# Patient Record
Sex: Female | Born: 1965 | Race: White | Hispanic: No | Marital: Married | Smoking: Current every day smoker
Health system: Southern US, Community
[De-identification: ages and names within clinical notes are randomized; demographics above are authoritative.]

## PROBLEM LIST (undated history)

## (undated) DIAGNOSIS — T4145XA Adverse effect of unspecified anesthetic, initial encounter: Secondary | ICD-10-CM

## (undated) DIAGNOSIS — IMO0002 Reserved for concepts with insufficient information to code with codable children: Secondary | ICD-10-CM

## (undated) DIAGNOSIS — R87619 Unspecified abnormal cytological findings in specimens from cervix uteri: Secondary | ICD-10-CM

## (undated) DIAGNOSIS — R7303 Prediabetes: Secondary | ICD-10-CM

## (undated) DIAGNOSIS — R87629 Unspecified abnormal cytological findings in specimens from vagina: Secondary | ICD-10-CM

## (undated) DIAGNOSIS — L853 Xerosis cutis: Secondary | ICD-10-CM

## (undated) DIAGNOSIS — R140 Abdominal distension (gaseous): Secondary | ICD-10-CM

## (undated) DIAGNOSIS — R1031 Right lower quadrant pain: Principal | ICD-10-CM

## (undated) DIAGNOSIS — R112 Nausea with vomiting, unspecified: Secondary | ICD-10-CM

## (undated) DIAGNOSIS — Z9889 Other specified postprocedural states: Secondary | ICD-10-CM

## (undated) DIAGNOSIS — T8859XA Other complications of anesthesia, initial encounter: Secondary | ICD-10-CM

## (undated) DIAGNOSIS — J302 Other seasonal allergic rhinitis: Secondary | ICD-10-CM

## (undated) DIAGNOSIS — E7800 Pure hypercholesterolemia, unspecified: Secondary | ICD-10-CM

## (undated) DIAGNOSIS — M779 Enthesopathy, unspecified: Secondary | ICD-10-CM

## (undated) HISTORY — DX: Unspecified abnormal cytological findings in specimens from cervix uteri: R87.619

## (undated) HISTORY — DX: Right lower quadrant pain: R10.31

## (undated) HISTORY — PX: TUBAL LIGATION: SHX77

## (undated) HISTORY — DX: Other seasonal allergic rhinitis: J30.2

## (undated) HISTORY — DX: Pure hypercholesterolemia, unspecified: E78.00

## (undated) HISTORY — DX: Unspecified abnormal cytological findings in specimens from vagina: R87.629

## (undated) HISTORY — DX: Xerosis cutis: L85.3

## (undated) HISTORY — DX: Reserved for concepts with insufficient information to code with codable children: IMO0002

## (undated) HISTORY — DX: Enthesopathy, unspecified: M77.9

## (undated) HISTORY — DX: Abdominal distension (gaseous): R14.0

## (undated) HISTORY — PX: ENDOMETRIAL ABLATION: SHX621

## (undated) HISTORY — PX: LEEP: SHX91

---

## 2001-11-04 ENCOUNTER — Encounter: Payer: Self-pay | Admitting: Family Medicine

## 2001-11-04 ENCOUNTER — Ambulatory Visit (HOSPITAL_COMMUNITY): Admission: RE | Admit: 2001-11-04 | Discharge: 2001-11-04 | Payer: Self-pay | Admitting: Family Medicine

## 2003-12-10 ENCOUNTER — Ambulatory Visit (HOSPITAL_COMMUNITY): Admission: RE | Admit: 2003-12-10 | Discharge: 2003-12-10 | Payer: Self-pay | Admitting: Family Medicine

## 2005-01-30 ENCOUNTER — Ambulatory Visit (HOSPITAL_COMMUNITY): Admission: RE | Admit: 2005-01-30 | Discharge: 2005-01-30 | Payer: Self-pay | Admitting: Family Medicine

## 2005-09-11 ENCOUNTER — Ambulatory Visit (HOSPITAL_COMMUNITY): Admission: RE | Admit: 2005-09-11 | Discharge: 2005-09-11 | Payer: Self-pay | Admitting: Family Medicine

## 2006-07-31 ENCOUNTER — Ambulatory Visit (HOSPITAL_COMMUNITY): Admission: RE | Admit: 2006-07-31 | Discharge: 2006-07-31 | Payer: Self-pay | Admitting: Family Medicine

## 2006-08-13 ENCOUNTER — Ambulatory Visit (HOSPITAL_COMMUNITY): Admission: RE | Admit: 2006-08-13 | Discharge: 2006-08-13 | Payer: Self-pay | Admitting: Family Medicine

## 2007-03-20 ENCOUNTER — Encounter: Payer: Self-pay | Admitting: Obstetrics & Gynecology

## 2007-03-20 ENCOUNTER — Ambulatory Visit (HOSPITAL_COMMUNITY): Admission: RE | Admit: 2007-03-20 | Discharge: 2007-03-20 | Payer: Self-pay | Admitting: Obstetrics & Gynecology

## 2008-03-02 ENCOUNTER — Other Ambulatory Visit: Admission: RE | Admit: 2008-03-02 | Discharge: 2008-03-02 | Payer: Self-pay | Admitting: Obstetrics and Gynecology

## 2008-05-20 ENCOUNTER — Ambulatory Visit (HOSPITAL_COMMUNITY): Admission: RE | Admit: 2008-05-20 | Discharge: 2008-05-20 | Payer: Self-pay | Admitting: Orthopaedic Surgery

## 2009-06-30 ENCOUNTER — Ambulatory Visit (HOSPITAL_COMMUNITY): Admission: RE | Admit: 2009-06-30 | Discharge: 2009-06-30 | Payer: Self-pay | Admitting: Obstetrics and Gynecology

## 2009-07-03 HISTORY — PX: OTHER SURGICAL HISTORY: SHX169

## 2009-09-23 ENCOUNTER — Other Ambulatory Visit: Admission: RE | Admit: 2009-09-23 | Discharge: 2009-09-23 | Payer: Self-pay | Admitting: Obstetrics and Gynecology

## 2009-12-13 ENCOUNTER — Ambulatory Visit (HOSPITAL_COMMUNITY): Admission: RE | Admit: 2009-12-13 | Discharge: 2009-12-13 | Payer: Self-pay | Admitting: Family Medicine

## 2010-02-03 ENCOUNTER — Ambulatory Visit (HOSPITAL_COMMUNITY): Admission: RE | Admit: 2010-02-03 | Discharge: 2010-02-03 | Payer: Self-pay | Admitting: Family Medicine

## 2010-07-24 ENCOUNTER — Encounter: Payer: Self-pay | Admitting: Family Medicine

## 2010-07-29 ENCOUNTER — Ambulatory Visit (HOSPITAL_COMMUNITY)
Admission: RE | Admit: 2010-07-29 | Discharge: 2010-07-29 | Payer: Self-pay | Source: Home / Self Care | Attending: Obstetrics & Gynecology | Admitting: Obstetrics & Gynecology

## 2010-11-15 NOTE — H&P (Signed)
NAME:  Danielle Robbins, Danielle Robbins                ACCOUNT NO.:  [id number]   MEDICAL RECORD NO.:  [id number]           PATIENT TYPE:  AMB   LOCATION:  DAY                           FACILITY:  [facility]   PHYSICIAN:  Lazaro Arms, M.D.   DATE OF BIRTH:  Nov 22, 1965   DATE OF ADMISSION:  DATE OF DISCHARGE:  LH                              HISTORY & PHYSICAL   CONTINUATION   As a result of heavy menstrual periods and bad cramping, we did an  ultrasound which revealed a normal uterus, normal endometrial stripe,  not clots, no other abnormalities.  As a result, she also denies any  pain with intercourse or pain during the month otherwise.  As a result,  she is a good candidate for endometrial ablation, and she is admitted  for such.   PAST MEDICAL HISTORY:  Significant for a LEEP procedure.   MEDICATIONS:  Flexeril and Vicodin for chronic back problems.   REVIEW OF SYSTEMS:  As per HPI, otherwise negative.   ALLERGIES:  None.   PHYSICAL EXAMINATION:  VITAL SIGNS:  Weight 144 pounds.  Blood pressure  120/60.  HEENT:  Unremarkable.  NECK:  Thyroid is normal.  LUNGS:  Clear.  HEART: Regular rate and rhythm.  No regurgitation or gallop.  BREASTS:  Without mass, discharge, skin changes.  ABDOMEN:  Benign.  No hepatosplenomegaly or mass.  PELVIC:  Normal external genitalia.  Vagina moist without discharge.  Cervix parous without lesions.  Uterus is normal size, shape, contour.  Ovaries normal, nontender.   IMPRESSION:  1. Menometrorrhagia.  2. Dysmenorrhea.   PLAN:  Patient admitted for hysteroscopy D&C, endometrial ablation. She  understands the risks, benefits, indications, alternatives and will  proceed.      Lazaro Arms, M.D.  Electronically Signed     LHE/MEDQ  D:  03/19/2007  T:  03/19/2007  Job:  [id number]

## 2010-11-15 NOTE — Op Note (Signed)
Danielle Robbins, Danielle Robbins                ACCOUNT NO.:  [id number]   MEDICAL RECORD NO.:  [id number]          PATIENT TYPE:  AMB   LOCATION:  DAY                           FACILITY:  [facility]   PHYSICIAN:  Lazaro Arms, M.D.   DATE OF BIRTH:  Mar 26, 1966   DATE OF PROCEDURE:  03/20/2007  DATE OF DISCHARGE:                               OPERATIVE REPORT   PREOPERATIVE DIAGNOSES:  1. Menometrorrhagia.  2. Dysmenorrhea.   POSTOPERATIVE DIAGNOSIS:  1. Menometrorrhagia.  2. Dysmenorrhea.   OPERATION PERFORMED:  Hysteroscopy dilation and curettage, endometrial  ablation.,   SURGEON:  Lazaro Arms, M.D.   ANESTHESIA:  General endotracheal.   FINDINGS:  The patient had normal endometrial cavity, no abnormalities,  no polyps, no fibroids, no septums.   DESCRIPTION OF PROCEDURE:  The patient was taken to the operating room  and placed in supine position with general endotracheal anesthesia,  placed in dorsal lithotomy position, prepped and draped in the usual  sterile fashion.  Graves speculum was placed.  Cervix was grasped.  Cervix dilated serially to allow passage of the hysteroscope.  Hysteroscopy was performed and found to be normal.  Thermachoice 3  endometrial ablation balloon was then used.  D5W was used 13 mL to  maintain a pressure between 190 and 200 mmHg throughout the procedure.  The total therapy time was 8 minutes and 48 seconds.  Temperature was 88  degrees Celsius.  The patient tolerated the procedure well.  She was  awakened from anesthesia and taken to recovery room in good and stable  condition.  All counts were correct.      Lazaro Arms, M.D.  Electronically Signed     LHE/MEDQ  D:  03/20/2007  T:  03/20/2007  Job:  [id number]

## 2010-11-15 NOTE — H&P (Signed)
NAME:  OSHA, RANE                ACCOUNT NO.:  [id number]   MEDICAL RECORD NO.:  [id number]           PATIENT TYPE:  AMB   LOCATION:  DAY                           FACILITY:  [facility]   PHYSICIAN:  Lazaro Arms, M.D.   DATE OF BIRTH:  10-31-1965   DATE OF ADMISSION:  DATE OF DISCHARGE:  LH                              HISTORY & PHYSICAL   Danielle Robbins is a 45 year old white female, gravida 1, para 1, status post  tubal ligation who has been having increasing difficulty with heavy  menstrual periods.   DICTATION ENDS HERE      Lazaro Arms, M.D.  Electronically Signed     LHE/MEDQ  D:  03/19/2007  T:  03/19/2007  Job:  [id number]

## 2011-04-10 ENCOUNTER — Other Ambulatory Visit (HOSPITAL_COMMUNITY): Payer: Self-pay | Admitting: Family Medicine

## 2011-04-10 ENCOUNTER — Ambulatory Visit (HOSPITAL_COMMUNITY)
Admission: RE | Admit: 2011-04-10 | Discharge: 2011-04-10 | Disposition: A | Discharge status: Home / Self Care | Payer: BC Managed Care – PPO | Source: Ambulatory Visit | Attending: Family Medicine | Admitting: Family Medicine

## 2011-04-10 DIAGNOSIS — R0781 Pleurodynia: Secondary | ICD-10-CM

## 2011-04-10 DIAGNOSIS — F17200 Nicotine dependence, unspecified, uncomplicated: Secondary | ICD-10-CM

## 2011-04-10 DIAGNOSIS — R0789 Other chest pain: Secondary | ICD-10-CM | POA: Insufficient documentation

## 2011-04-13 LAB — COMPREHENSIVE METABOLIC PANEL
BUN: 7
GFR calc Af Amer: >60
Glucose, Bld: 82
Potassium: 3.8
Total Bilirubin: 0.8
Total Protein: 6.9

## 2011-04-13 LAB — CBC
HCT: 41.2
MCHC: 34.8
Platelets: 222
WBC: 7.8

## 2011-04-13 LAB — DIFFERENTIAL
Basophils Relative: 0
Eosinophils Relative: 1
Lymphocytes Relative: 37
Lymphs Abs: 2.9

## 2011-04-13 LAB — URINALYSIS, ROUTINE W REFLEX MICROSCOPIC
Glucose, UA: NEGATIVE
Hgb urine dipstick: NEGATIVE
Nitrite: NEGATIVE
Protein, ur: NEGATIVE
pH: 7

## 2011-07-13 ENCOUNTER — Other Ambulatory Visit: Payer: Self-pay | Admitting: Adult Health

## 2011-07-13 DIAGNOSIS — Z139 Encounter for screening, unspecified: Secondary | ICD-10-CM

## 2011-08-01 ENCOUNTER — Ambulatory Visit (HOSPITAL_COMMUNITY)
Admission: RE | Admit: 2011-08-01 | Discharge: 2011-08-01 | Disposition: A | Discharge status: Home / Self Care | Payer: BC Managed Care – PPO | Source: Ambulatory Visit | Attending: Adult Health | Admitting: Adult Health

## 2011-08-01 DIAGNOSIS — Z139 Encounter for screening, unspecified: Secondary | ICD-10-CM

## 2011-08-01 DIAGNOSIS — Z1231 Encounter for screening mammogram for malignant neoplasm of breast: Secondary | ICD-10-CM | POA: Insufficient documentation

## 2011-09-27 ENCOUNTER — Other Ambulatory Visit: Payer: Self-pay | Admitting: Adult Health

## 2011-09-27 ENCOUNTER — Other Ambulatory Visit (HOSPITAL_COMMUNITY)
Admission: RE | Admit: 2011-09-27 | Discharge: 2011-09-27 | Disposition: A | Discharge status: Home / Self Care | Payer: BC Managed Care – PPO | Source: Ambulatory Visit | Attending: Obstetrics and Gynecology | Admitting: Obstetrics and Gynecology

## 2011-09-27 DIAGNOSIS — Z01419 Encounter for gynecological examination (general) (routine) without abnormal findings: Secondary | ICD-10-CM | POA: Insufficient documentation

## 2011-09-27 DIAGNOSIS — Z1159 Encounter for screening for other viral diseases: Secondary | ICD-10-CM | POA: Insufficient documentation

## 2012-01-28 IMAGING — CR DG RIBS W/ CHEST 3+V*L*
4 series · 4 of 4 positions shown · non-contrast
Comparison: 12/13/2009

CLINICAL DATA: Smoker, left side rib pain for 1 year

LEFT RIBS AND CHEST - 3+ VIEW

[view not recorded (1 of 4)]
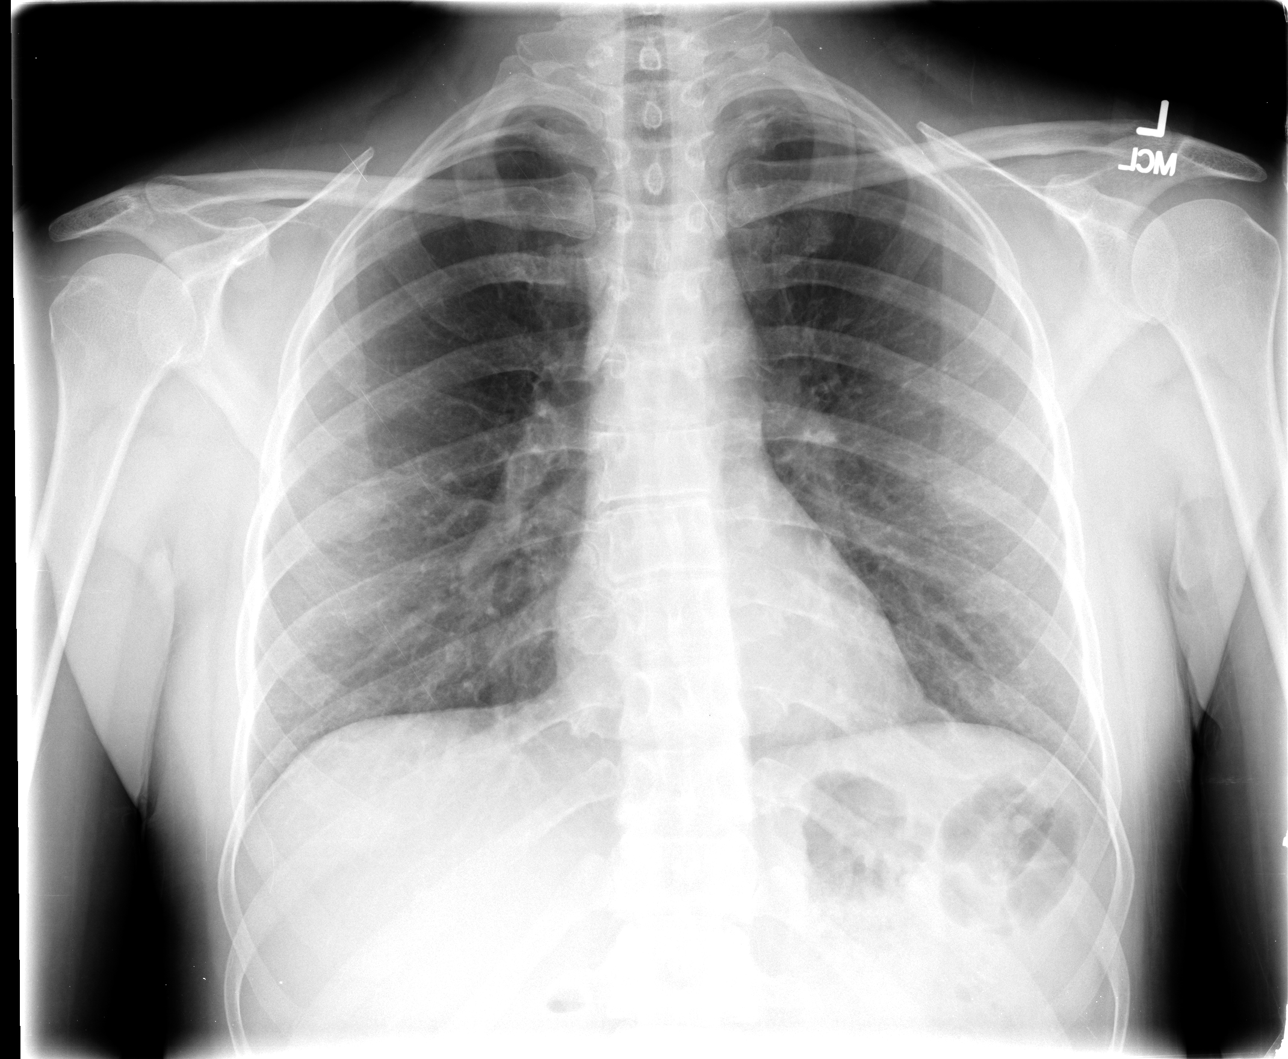

[view not recorded (2 of 4)]
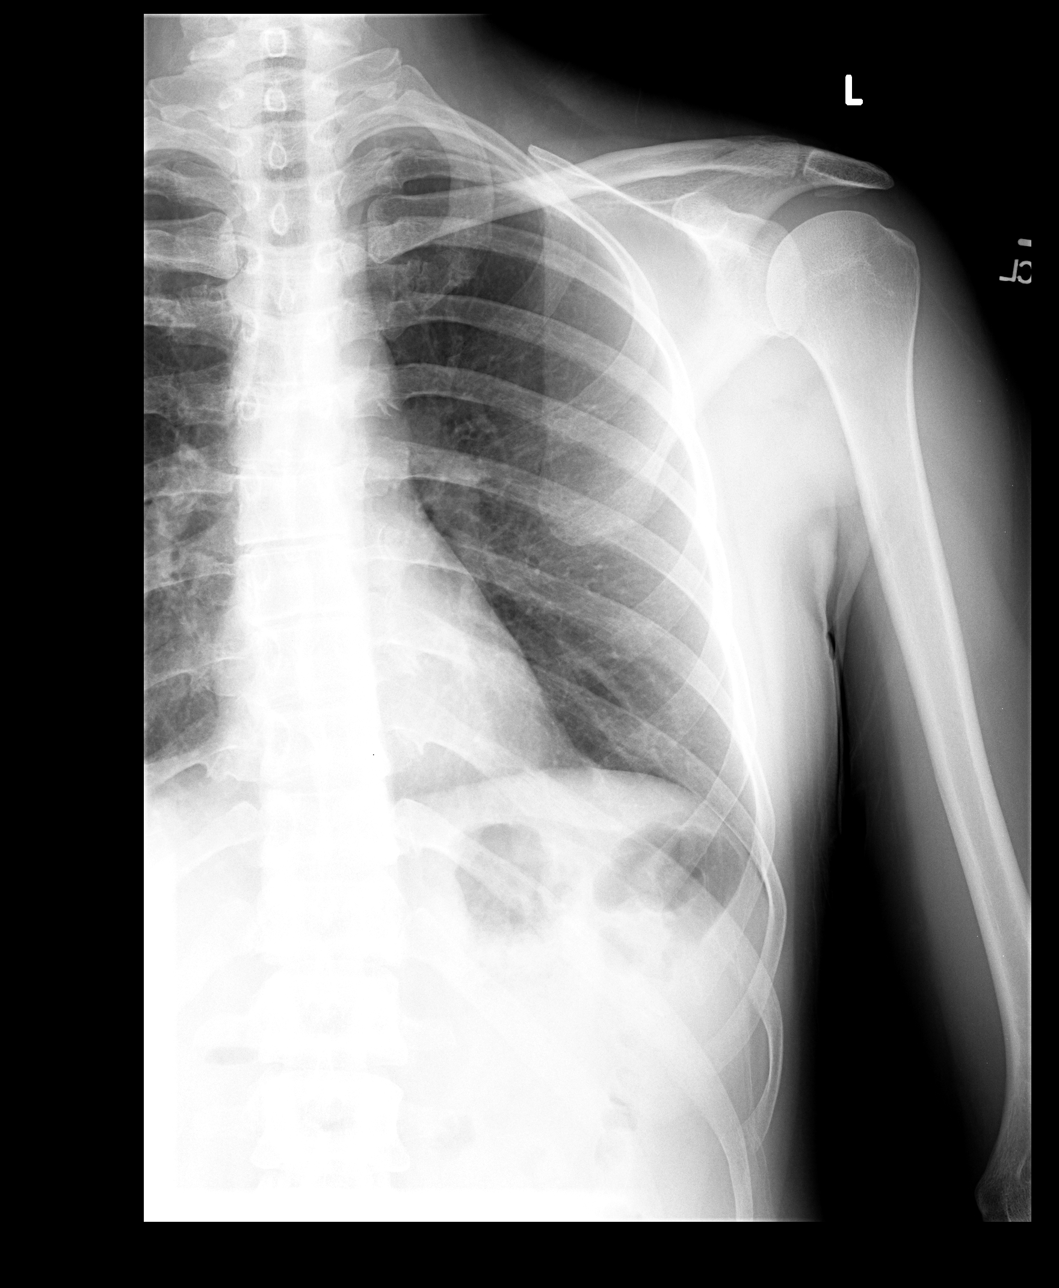

[view not recorded (3 of 4)]
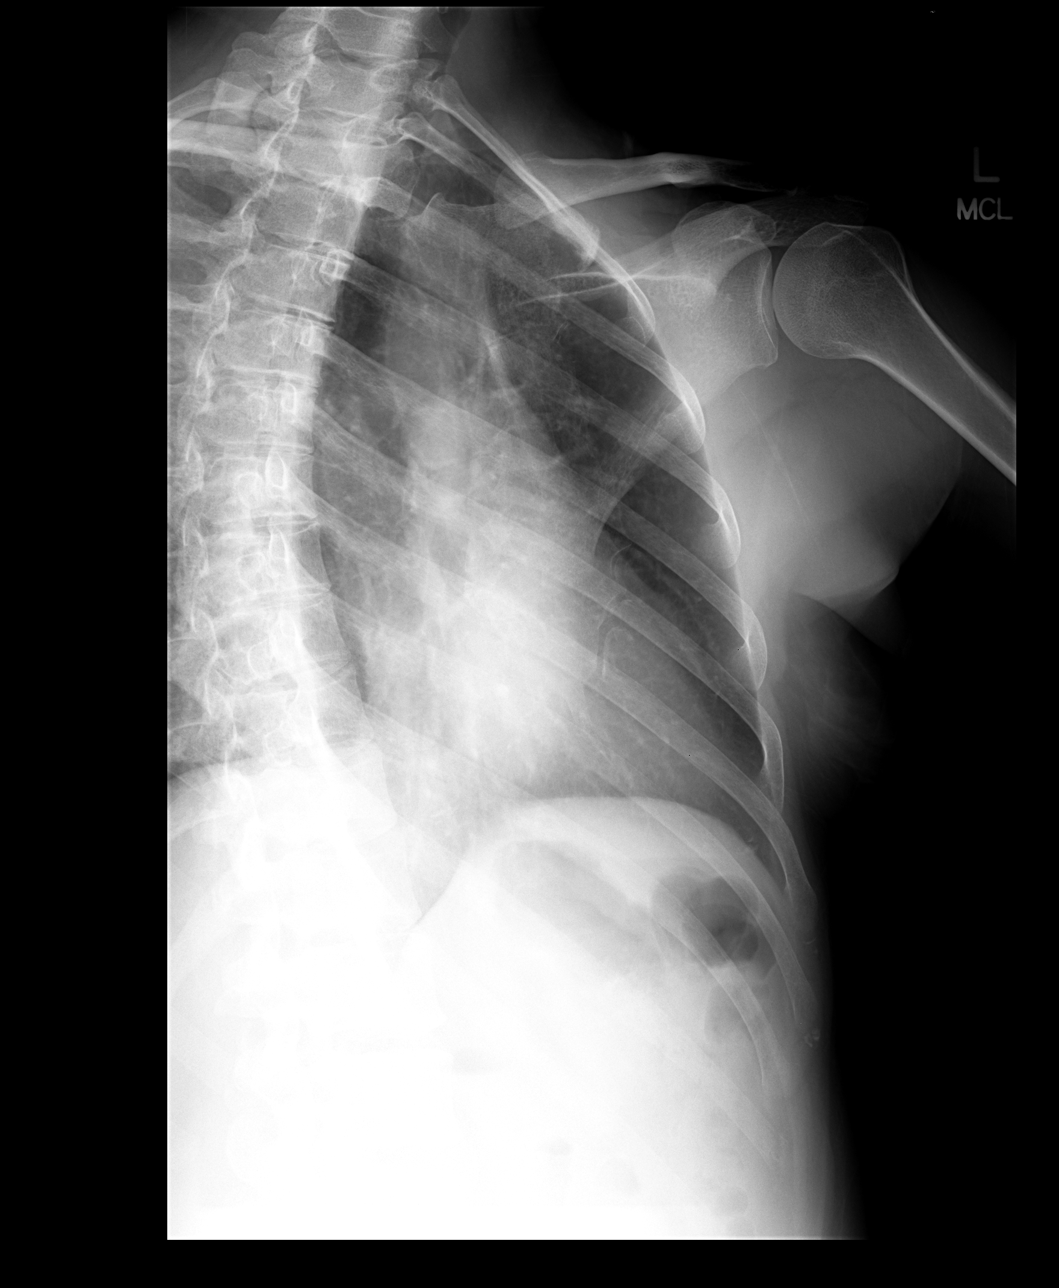

[view not recorded (4 of 4)]
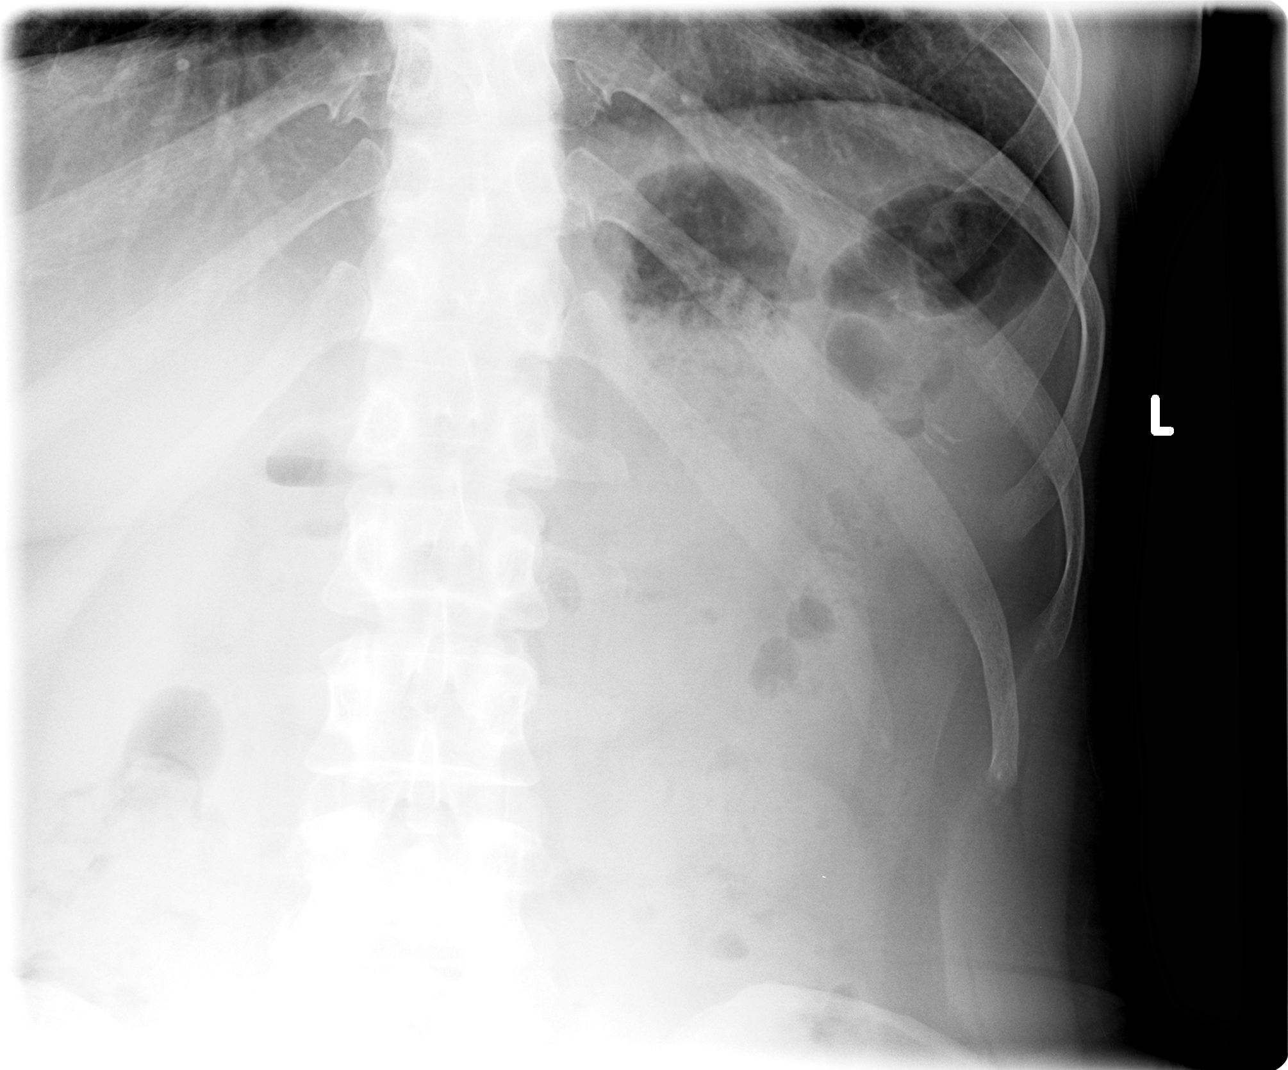

[4 of 4 positions shown; findings below may reference images not displayed]

FINDINGS: Normal heart size, mediastinal contours, and pulmonary vascularity.
Chronic peribronchial thickening.
No pulmonary infiltrate, pleural effusion or pneumothorax.
No acute osseous findings.
IMPRESSION: No acute abnormalities.

## 2012-09-10 ENCOUNTER — Other Ambulatory Visit: Payer: Self-pay | Admitting: Adult Health

## 2012-09-10 DIAGNOSIS — Z139 Encounter for screening, unspecified: Secondary | ICD-10-CM

## 2012-09-12 ENCOUNTER — Ambulatory Visit (HOSPITAL_COMMUNITY)
Admission: RE | Admit: 2012-09-12 | Discharge: 2012-09-12 | Disposition: A | Discharge status: Home / Self Care | Payer: BC Managed Care – PPO | Source: Ambulatory Visit | Attending: Adult Health | Admitting: Adult Health

## 2012-09-12 DIAGNOSIS — Z139 Encounter for screening, unspecified: Secondary | ICD-10-CM

## 2012-09-12 DIAGNOSIS — Z1231 Encounter for screening mammogram for malignant neoplasm of breast: Secondary | ICD-10-CM | POA: Insufficient documentation

## 2012-09-24 ENCOUNTER — Other Ambulatory Visit: Payer: Self-pay | Admitting: Adult Health

## 2012-10-02 ENCOUNTER — Encounter: Payer: Self-pay | Admitting: *Deleted

## 2012-10-02 DIAGNOSIS — E7800 Pure hypercholesterolemia, unspecified: Secondary | ICD-10-CM | POA: Insufficient documentation

## 2012-10-03 ENCOUNTER — Encounter: Payer: Self-pay | Admitting: Adult Health

## 2012-10-03 ENCOUNTER — Ambulatory Visit (INDEPENDENT_AMBULATORY_CARE_PROVIDER_SITE_OTHER): Payer: BC Managed Care – PPO | Admitting: Adult Health

## 2012-10-03 VITALS — BP 114/60 | HR 72 | Ht 64.5 in | Wt 163.0 lb

## 2012-10-03 DIAGNOSIS — E7800 Pure hypercholesterolemia, unspecified: Secondary | ICD-10-CM

## 2012-10-03 DIAGNOSIS — Z0000 Encounter for general adult medical examination without abnormal findings: Secondary | ICD-10-CM

## 2012-10-03 DIAGNOSIS — Z1212 Encounter for screening for malignant neoplasm of rectum: Secondary | ICD-10-CM

## 2012-10-03 DIAGNOSIS — Z01419 Encounter for gynecological examination (general) (routine) without abnormal findings: Secondary | ICD-10-CM

## 2012-10-03 LAB — HEMOCCULT GUIAC POC 1CARD (OFFICE): Fecal Occult Blood, POC: NEGATIVE

## 2012-10-03 NOTE — Patient Instructions (Addendum)
Get fasting  Labs in near future, get mammogram yearly, physical in 1 year, Sign up for my chart, try  Taking 2000iu vitamin D daily.Colonoscopy at 49.

## 2012-10-03 NOTE — Progress Notes (Signed)
Subjective:     Patient ID: Danielle Robbins, female   DOB: [birth year], 47 y.o.   MRN: [id number]  HPIJennie is a  47 year old white female gravida 1 para 1 in today for her physical exam. She is beginning to think she may be going through early menopause, she is having some hot flashes.She does not have periods secondary to endometrial ablation.    Review of Systems Patient denies any headaches, blurred vision, shortness of breath, chest pain, abdominal pain, problems with bowel movements, urination, or intercourse. She denies any musculoskeletal complaints, or current mood changes. She is complaining of some hot flashes and some night sweats, and we did discuss some menopausal symptoms. We also discussed hormone replacement therapy and other options and at this time she can think of about it. I reviewed past medical surgical social and family history in epic. I reviewed allergies and medications.     Objective:   Physical Exam Skin: Warm and dry Neck: Midline trachea. Thyroid normal Lungs: Clear to auscultation bilaterally. Breasts: No dominant palpable mass, retraction, or nipple discharge. Cardiovascular: Regular rate and rhythm. Abdomen: Soft and non-tender, no hepatosplenomegaly. Pelvic: External genitalia is normal and appearance. The vagina is normal in appearance. Cervix is bulbous with negative cervical motion tenderness( no pap this year was normal with Negative HPV last year). Uterus felt to be normal size shape and contour. No adnexal masses or tenderness noted. Rectal: no polyps or hemorrhoids, hemoccult negative. Extremities: no swelling or varicose veins. Psych: no changes.      Assessment:     Yearly exam Hypercholesterolemia   Peri menopausal symptoms Plan:     Take 2000iu per day Mammogram yearly Colonoscopy at 49 Fasting labs in near future   Return to clinic in 1 year physical or prn problems Call if desires any treatment for peri menopausal symptoms

## 2013-03-10 ENCOUNTER — Other Ambulatory Visit: Payer: Self-pay | Admitting: *Deleted

## 2013-03-11 MED ORDER — SIMVASTATIN 20 MG PO TABS: ORAL_TABLET | ORAL | Status: DC

## 2013-03-23 ENCOUNTER — Other Ambulatory Visit: Payer: Self-pay | Admitting: Obstetrics and Gynecology

## 2013-09-24 ENCOUNTER — Other Ambulatory Visit: Payer: Self-pay | Admitting: Adult Health

## 2013-09-24 DIAGNOSIS — Z1231 Encounter for screening mammogram for malignant neoplasm of breast: Secondary | ICD-10-CM

## 2013-09-29 ENCOUNTER — Ambulatory Visit (HOSPITAL_COMMUNITY)
Admission: RE | Admit: 2013-09-29 | Discharge: 2013-09-29 | Disposition: A | Discharge status: Home / Self Care | Payer: BC Managed Care – PPO | Source: Ambulatory Visit | Attending: Adult Health | Admitting: Adult Health

## 2013-09-29 DIAGNOSIS — Z1231 Encounter for screening mammogram for malignant neoplasm of breast: Secondary | ICD-10-CM | POA: Insufficient documentation

## 2013-10-01 ENCOUNTER — Other Ambulatory Visit: Payer: Self-pay | Admitting: Obstetrics & Gynecology

## 2013-10-15 ENCOUNTER — Ambulatory Visit (INDEPENDENT_AMBULATORY_CARE_PROVIDER_SITE_OTHER): Payer: BC Managed Care – PPO | Admitting: Adult Health

## 2013-10-15 ENCOUNTER — Encounter: Payer: Self-pay | Admitting: Adult Health

## 2013-10-15 VITALS — BP 112/64 | HR 74 | Ht 65.0 in | Wt 165.0 lb

## 2013-10-15 DIAGNOSIS — Z1212 Encounter for screening for malignant neoplasm of rectum: Secondary | ICD-10-CM

## 2013-10-15 DIAGNOSIS — E7800 Pure hypercholesterolemia, unspecified: Secondary | ICD-10-CM

## 2013-10-15 DIAGNOSIS — Z01419 Encounter for gynecological examination (general) (routine) without abnormal findings: Secondary | ICD-10-CM

## 2013-10-15 LAB — HEMOCCULT GUIAC POC 1CARD (OFFICE): Fecal Occult Blood, POC: NEGATIVE

## 2013-10-15 NOTE — Progress Notes (Signed)
Patient ID: Danielle Robbins, female   DOB: 1965/09/30, 48 y.o.   MRN: [id number] History of Present Illness:  Danielle Robbins is a 48 year old white female in for a physical, she had a normal pap with negative HPV 09/27/11.  Current Medications, Allergies, Past Medical History, Past Surgical History, Family History and Social History were reviewed in [employer] record.     Review of Systems:  Patient denies any headaches,but has sinus pressure,no blurred vision, shortness of breath, chest pain, abdominal pain, problems with bowel movements, urination, or intercourse. No joint pain or mood swings.She is seeing ENT, Dr Redmond Pulling in [place].   Physical Exam:BP 112/64  Pulse 74  Ht 5\' 5"  (1.651 m)  Wt 165 lb (74.844 kg)  BMI 27.46 kg/m2 General:  Well developed, well nourished, no acute distress Skin:  Warm and dry Neck:  Midline trachea, normal thyroid Lungs; Clear to auscultation bilaterally Breast:  No dominant palpable mass, retraction, or nipple discharge Cardiovascular: Regular rate and rhythm Abdomen:  Soft, non tender, no hepatosplenomegaly Pelvic:  External genitalia is normal in appearance.  The vagina is normal in appearance. The cervix is bulbous.  Uterus is felt to be normal size, shape, and contour.  No adnexal masses or tenderness noted. Rectal: Good sphincter tone, no polyps, or hemorrhoids felt.  Hemoccult negative. Extremities:  No swelling or varicosities noted Psych:  No mood changes, alert and cooperative, seems happy   Impression: Yearly gyn exam, no pap Elevated cholesterol    Plan: Physical in 1 year Mammogram yearly Colonoscopy at 88  Labs in near future fasting, CBC,CMP,TSH and lipids

## 2013-10-15 NOTE — Patient Instructions (Signed)
Physical in 1 year Mammogram yearly Colonoscopy at 37 Labs in near future

## 2013-10-16 ENCOUNTER — Other Ambulatory Visit: Payer: BC Managed Care – PPO

## 2013-10-16 DIAGNOSIS — Z13220 Encounter for screening for lipoid disorders: Secondary | ICD-10-CM

## 2013-10-16 DIAGNOSIS — Z1329 Encounter for screening for other suspected endocrine disorder: Secondary | ICD-10-CM

## 2013-10-16 DIAGNOSIS — Z0000 Encounter for general adult medical examination without abnormal findings: Secondary | ICD-10-CM

## 2013-10-16 LAB — COMPREHENSIVE METABOLIC PANEL
ALT: 16 U/L (ref 0–35)
AST: 19 U/L (ref 0–37)
Albumin: 4.2 g/dL (ref 3.5–5.2)
Alkaline Phosphatase: 86 U/L (ref 39–117)
BUN: 9 mg/dL (ref 6–23)
CALCIUM: 9.3 mg/dL (ref 8.4–10.5)
CO2: 27 mEq/L (ref 19–32)
CREATININE: 0.78 mg/dL (ref 0.50–1.10)
Chloride: 105 mEq/L (ref 96–112)
Glucose, Bld: 89 mg/dL (ref 70–99)
POTASSIUM: 4 meq/L (ref 3.5–5.3)
Sodium: 140 mEq/L (ref 135–145)
Total Bilirubin: 0.5 mg/dL (ref 0.2–1.2)
Total Protein: 7 g/dL (ref 6.0–8.3)

## 2013-10-16 LAB — CBC
HEMATOCRIT: 43.8 % (ref 36.0–46.0)
HEMOGLOBIN: 15.3 g/dL — AB (ref 12.0–15.0)
MCH: 30.4 pg (ref 26.0–34.0)
MCHC: 34.9 g/dL (ref 30.0–36.0)
MCV: 86.9 fL (ref 78.0–100.0)
Platelets: 214 10*3/uL (ref 150–400)
RBC: 5.04 MIL/uL (ref 3.87–5.11)
RDW: 13.5 % (ref 11.5–15.5)
WBC: 8.2 10*3/uL (ref 4.0–10.5)

## 2013-10-16 LAB — LIPID PANEL
CHOLESTEROL: 189 mg/dL (ref 0–200)
HDL: 36 mg/dL — ABNORMAL LOW (ref >39)
LDL Cholesterol: 129 mg/dL — ABNORMAL HIGH (ref 0–99)
TRIGLYCERIDES: 121 mg/dL (ref <150)
Total CHOL/HDL Ratio: 5.3 Ratio
VLDL: 24 mg/dL (ref 0–40)

## 2013-10-16 LAB — TSH: TSH: 1.187 u[IU]/mL (ref 0.350–4.500)

## 2013-10-21 ENCOUNTER — Telehealth: Payer: Self-pay | Admitting: Adult Health

## 2013-10-21 NOTE — Telephone Encounter (Signed)
Pt aware of labs and need to decrease carbs,increase activity and take 3 gm krill oil in divided doses with a meal

## 2014-01-29 ENCOUNTER — Ambulatory Visit (HOSPITAL_COMMUNITY)
Admission: RE | Admit: 2014-01-29 | Discharge: 2014-01-29 | Disposition: A | Discharge status: Home / Self Care | Payer: BC Managed Care – PPO | Source: Ambulatory Visit | Attending: Family Medicine | Admitting: Family Medicine

## 2014-01-29 ENCOUNTER — Other Ambulatory Visit (HOSPITAL_COMMUNITY): Payer: Self-pay | Admitting: Family Medicine

## 2014-01-29 DIAGNOSIS — F17210 Nicotine dependence, cigarettes, uncomplicated: Secondary | ICD-10-CM

## 2014-01-29 DIAGNOSIS — F17200 Nicotine dependence, unspecified, uncomplicated: Secondary | ICD-10-CM | POA: Insufficient documentation

## 2014-01-29 DIAGNOSIS — R079 Chest pain, unspecified: Secondary | ICD-10-CM

## 2014-03-10 ENCOUNTER — Other Ambulatory Visit: Payer: Self-pay | Admitting: Obstetrics & Gynecology

## 2014-05-04 ENCOUNTER — Encounter: Payer: Self-pay | Admitting: Adult Health

## 2014-08-24 ENCOUNTER — Other Ambulatory Visit: Payer: Self-pay | Admitting: Obstetrics & Gynecology

## 2014-09-08 ENCOUNTER — Other Ambulatory Visit: Payer: Self-pay | Admitting: Adult Health

## 2014-09-08 DIAGNOSIS — Z1231 Encounter for screening mammogram for malignant neoplasm of breast: Secondary | ICD-10-CM

## 2014-09-29 ENCOUNTER — Telehealth: Payer: Self-pay | Admitting: *Deleted

## 2014-09-29 ENCOUNTER — Telehealth: Payer: Self-pay | Admitting: Obstetrics & Gynecology

## 2014-09-29 MED ORDER — SIMVASTATIN 20 MG PO TABS: 20.0000 mg | ORAL_TABLET | Freq: Every evening | ORAL | Status: DC

## 2014-09-29 NOTE — Telephone Encounter (Signed)
Received a [facility] from [facility] for refill request for Simvastatin 20 mg. Rx for Simvastatin 20 mg q evening in Epic from 08/24/2014 with 4 refills, however pt needed a 90 day supply due to insurance. Simvastatin 20 mg tab po daily, #90, no refills [facility] [place]. Pt has annual scheduled in April.

## 2014-10-02 ENCOUNTER — Ambulatory Visit (HOSPITAL_COMMUNITY): Payer: Self-pay

## 2014-10-05 ENCOUNTER — Ambulatory Visit (HOSPITAL_COMMUNITY): Payer: Self-pay

## 2014-10-20 ENCOUNTER — Encounter: Payer: Self-pay | Admitting: Adult Health

## 2014-10-20 ENCOUNTER — Other Ambulatory Visit (HOSPITAL_COMMUNITY)
Admission: RE | Admit: 2014-10-20 | Discharge: 2014-10-20 | Disposition: A | Discharge status: Home / Self Care | Payer: BLUE CROSS/BLUE SHIELD | Source: Ambulatory Visit | Attending: Adult Health | Admitting: Adult Health

## 2014-10-20 ENCOUNTER — Ambulatory Visit (INDEPENDENT_AMBULATORY_CARE_PROVIDER_SITE_OTHER): Payer: BLUE CROSS/BLUE SHIELD | Admitting: Adult Health

## 2014-10-20 VITALS — BP 118/64 | HR 84 | Ht 65.0 in | Wt 172.0 lb

## 2014-10-20 DIAGNOSIS — Z1212 Encounter for screening for malignant neoplasm of rectum: Secondary | ICD-10-CM

## 2014-10-20 DIAGNOSIS — Z1151 Encounter for screening for human papillomavirus (HPV): Secondary | ICD-10-CM | POA: Diagnosis present

## 2014-10-20 DIAGNOSIS — L853 Xerosis cutis: Secondary | ICD-10-CM

## 2014-10-20 DIAGNOSIS — Z01419 Encounter for gynecological examination (general) (routine) without abnormal findings: Secondary | ICD-10-CM

## 2014-10-20 DIAGNOSIS — E7800 Pure hypercholesterolemia, unspecified: Secondary | ICD-10-CM

## 2014-10-20 HISTORY — DX: Xerosis cutis: L85.3

## 2014-10-20 LAB — HEMOCCULT GUIAC POC 1CARD (OFFICE): Fecal Occult Blood, POC: NEGATIVE

## 2014-10-20 NOTE — Patient Instructions (Signed)
Mammogram yearly Colonoscopy at 49  Physical in 1 year

## 2014-10-20 NOTE — Progress Notes (Signed)
Patient ID: Danielle Robbins, female   DOB: 1965/11/10, 49 y.o.   MRN: [id number] History of Present Illness: Danielle Robbins is a 49 year old white female in for well woman gyn exam and pap.She complains of dry skin.   Current Medications, Allergies, Past Medical History, Past Surgical History, Family History and Social History were reviewed in [employer] record.     Review of Systems: Patient denies any headaches, hearing loss, fatigue, blurred vision, shortness of breath, chest pain, abdominal pain, problems with bowel movements, urination, or intercourse. No joint pain or mood swings.Has dry skin.    Physical Exam:BP 118/64 mmHg  Pulse 84  Ht 5\' 5"  (1.651 m)  Wt 172 lb (78.019 kg)  BMI 28.62 kg/m2 General:  Well developed, well nourished, no acute distress Skin:  Warm and dry,tan, she has tanning bed, Neck:  Midline trachea, normal thyroid, good ROM, no lymphadenopathy Lungs; Clear to auscultation bilaterally Breast:  No dominant palpable mass, retraction, or nipple discharge Cardiovascular: Regular rate and rhythm Abdomen:  Soft, non tender, no hepatosplenomegaly Pelvic:  External genitalia is normal in appearance, no lesions.  The vagina is normal in appearance. Urethra has no lesions or masses. The cervix is bulbous.  Uterus is felt to be normal size, shape, and contour.  No adnexal masses or tenderness noted.Bladder is non tender, no masses felt. Rectal: Good sphincter tone, no polyps, internal and external hemorrhoids felt.  Hemoccult negative. Extremities/musculoskeletal:  No swelling or varicosities noted, no clubbing or cyanosis Psych:  No mood changes, alert and cooperative,seems happy Do not tan daily and use creams not lotions  Impression: Well woman gyn exam with pap Dry skin Elevated cholesterol     Plan: Check CMP and lipids in am Physical in 1 year Mammogram yearly Colonoscopy at 49 Try cetaphil or aqua phor Continue meds, has refills

## 2014-10-22 LAB — CYTOLOGY - PAP

## 2014-10-22 LAB — COMPREHENSIVE METABOLIC PANEL
ALT: 21 IU/L (ref 0–32)
AST: 22 IU/L (ref 0–40)
Albumin/Globulin Ratio: 1.5 (ref 1.1–2.5)
Albumin: 4.1 g/dL (ref 3.5–5.5)
Alkaline Phosphatase: 99 IU/L (ref 39–117)
BUN/Creatinine Ratio: 14 (ref 9–23)
BUN: 11 mg/dL (ref 6–24)
Bilirubin Total: 0.4 mg/dL (ref 0.0–1.2)
CALCIUM: 9.4 mg/dL (ref 8.7–10.2)
CO2: 22 mmol/L (ref 18–29)
CREATININE: 0.8 mg/dL (ref 0.57–1.00)
Chloride: 102 mmol/L (ref 97–108)
GFR calc Af Amer: 100 mL/min/{1.73_m2} (ref >59)
GFR calc non Af Amer: 87 mL/min/{1.73_m2} (ref >59)
Globulin, Total: 2.7 g/dL (ref 1.5–4.5)
Glucose: 91 mg/dL (ref 65–99)
POTASSIUM: 4 mmol/L (ref 3.5–5.2)
SODIUM: 141 mmol/L (ref 134–144)
Total Protein: 6.8 g/dL (ref 6.0–8.5)

## 2014-10-22 LAB — LIPID PANEL
CHOL/HDL RATIO: 6 ratio — AB (ref 0.0–4.4)
Cholesterol, Total: 211 mg/dL — ABNORMAL HIGH (ref 100–199)
HDL: 35 mg/dL — ABNORMAL LOW (ref >39)
LDL Calculated: 140 mg/dL — ABNORMAL HIGH (ref 0–99)
Triglycerides: 180 mg/dL — ABNORMAL HIGH (ref 0–149)
VLDL Cholesterol Cal: 36 mg/dL (ref 5–40)

## 2014-10-28 ENCOUNTER — Telehealth: Payer: Self-pay | Admitting: Adult Health

## 2014-10-28 MED ORDER — ATORVASTATIN CALCIUM 20 MG PO TABS: ORAL_TABLET | ORAL | Status: DC

## 2014-10-28 NOTE — Telephone Encounter (Signed)
Left message to call me about labs  

## 2014-10-28 NOTE — Telephone Encounter (Signed)
Pt aware of labs, will stop Zocor and change to Lipitor 20 mg and recheck labs in 12 weeks

## 2014-12-14 ENCOUNTER — Ambulatory Visit (HOSPITAL_COMMUNITY)
Admission: RE | Admit: 2014-12-14 | Discharge: 2014-12-14 | Disposition: A | Discharge status: Home / Self Care | Payer: BLUE CROSS/BLUE SHIELD | Source: Ambulatory Visit | Attending: Adult Health | Admitting: Adult Health

## 2014-12-14 DIAGNOSIS — Z1231 Encounter for screening mammogram for malignant neoplasm of breast: Secondary | ICD-10-CM | POA: Insufficient documentation

## 2015-02-26 ENCOUNTER — Other Ambulatory Visit: Payer: Self-pay | Admitting: Adult Health

## 2015-03-01 ENCOUNTER — Other Ambulatory Visit: Payer: Self-pay | Admitting: *Deleted

## 2015-03-01 MED ORDER — ATORVASTATIN CALCIUM 20 MG PO TABS: 20.0000 mg | ORAL_TABLET | Freq: Every day | ORAL | Status: DC

## 2015-03-12 ENCOUNTER — Ambulatory Visit (HOSPITAL_COMMUNITY)
Admission: RE | Admit: 2015-03-12 | Discharge: 2015-03-12 | Disposition: A | Discharge status: Home / Self Care | Payer: BLUE CROSS/BLUE SHIELD | Source: Ambulatory Visit | Attending: Adult Health | Admitting: Adult Health

## 2015-03-12 ENCOUNTER — Encounter: Payer: Self-pay | Admitting: Adult Health

## 2015-03-12 ENCOUNTER — Ambulatory Visit (INDEPENDENT_AMBULATORY_CARE_PROVIDER_SITE_OTHER): Payer: BLUE CROSS/BLUE SHIELD | Admitting: Adult Health

## 2015-03-12 ENCOUNTER — Ambulatory Visit (HOSPITAL_COMMUNITY): Payer: BLUE CROSS/BLUE SHIELD

## 2015-03-12 VITALS — BP 118/72 | HR 84 | Temp 98.6°F | Ht 65.0 in | Wt 176.0 lb

## 2015-03-12 DIAGNOSIS — R1031 Right lower quadrant pain: Secondary | ICD-10-CM

## 2015-03-12 DIAGNOSIS — R140 Abdominal distension (gaseous): Secondary | ICD-10-CM | POA: Diagnosis not present

## 2015-03-12 HISTORY — DX: Abdominal distension (gaseous): R14.0

## 2015-03-12 HISTORY — DX: Right lower quadrant pain: R10.31

## 2015-03-12 LAB — POCT URINALYSIS DIPSTICK
GLUCOSE UA: NEGATIVE
LEUKOCYTES UA: NEGATIVE
NITRITE UA: NEGATIVE
Protein, UA: NEGATIVE
RBC UA: NEGATIVE

## 2015-03-12 NOTE — Progress Notes (Signed)
Subjective:     Patient ID: Danielle Robbins, female   DOB: Sep 19, 1965, 49 y.o.   MRN: [id number]  HPI Danielle Robbins is a 49 year old white female in complaining of pain in right side for 1 week and has bloating that is worse in last day or so, no problems voiding or with BMs had 1 today.Felt bad yesterday and left work and went home to sleep.She is sp ablation so has no period.  Review of Systems Patient denies any headaches, hearing loss, fatigue, blurred vision, shortness of breath, chest pain, problems with bowel movements, urination, or intercourse. No joint pain or mood swings.See HPI for positives.    Reviewed past medical,surgical, social and family history. Reviewed medications and allergies.  Objective:   Physical Exam BP 118/72 mmHg  Pulse 84  Temp(Src) 98.6 F (37 C)  Ht '5\' 5"'  (1.651 m)  Wt 176 lb (79.833 kg)  BMI 29.29 kg/m2  Urine negative, Skin warm and dry.Pelvic: external genitalia is normal in appearance no lesions, vagina: scant discharge without odor,urethra has no lesions or masses noted, cervix:smooth,negative CMT uterus: normal size, shape and contour, mildly tender, no masses felt, adnexa: no masses, RLQ tenderness noted, no rebound. Bladder is non tender and no masses felt.Adomen has good bowel sounds in all 4 quadrants, soft, is tender RLQ, looks distended, no waves, like with fluid.Will get Korea now and labs and F/U in 3 days.She declines pain meds,talked could be ovarian cyst.Will get Xray to call me if abnormal Korea.    Assessment:     RLQ pain Bloating     Plan:     Get gyn [place] now at [facility] CBC,CMP and ESR Follow up in 3 days Review handout on pelvic pain If has fever,increased pain or vomiting go to ER

## 2015-03-12 NOTE — Patient Instructions (Signed)
Pelvic Pain Female pelvic pain can be caused by many different things and start from a variety of places. Pelvic pain refers to pain that is located in the lower half of the abdomen and between your hips. The pain may occur over a short period of time (acute) or may be reoccurring (chronic). The cause of pelvic pain may be related to disorders affecting the female reproductive organs (gynecologic), but it may also be related to the bladder, kidney stones, an intestinal complication, or muscle or skeletal problems. Getting help right away for pelvic pain is important, especially if there has been severe, sharp, or a sudden onset of unusual pain. It is also important to get help right away because some types of pelvic pain can be life threatening.  CAUSES  Below are only some of the causes of pelvic pain. The causes of pelvic pain can be in one of several categories.   Gynecologic.  Pelvic inflammatory disease.  Sexually transmitted infection.  Ovarian cyst or a twisted ovarian ligament (ovarian torsion).  Uterine lining that grows outside the uterus (endometriosis).  Fibroids, cysts, or tumors.  Ovulation.  Pregnancy.  Pregnancy that occurs outside the uterus (ectopic pregnancy).  Miscarriage.  Labor.  Abruption of the placenta or ruptured uterus.  Infection.  Uterine infection (endometritis).  Bladder infection.  Diverticulitis.  Miscarriage related to a uterine infection (septic abortion).  Bladder.  Inflammation of the bladder (cystitis).  Kidney stone(s).  Gastrointestinal.  Constipation.  Diverticulitis.  Neurologic.  Trauma.  Feeling pelvic pain because of mental or emotional causes (psychosomatic).  Cancers of the bowel or pelvis. EVALUATION  Your caregiver will want to take a careful history of your concerns. This includes recent changes in your health, a careful gynecologic history of your periods (menses), and a sexual history. Obtaining your family  history and medical history is also important. Your caregiver may suggest a pelvic exam. A pelvic exam will help identify the location and severity of the pain. It also helps in the evaluation of which organ system may be involved. In order to identify the cause of the pelvic pain and be properly treated, your caregiver may order tests. These tests may include:   A pregnancy test.  Pelvic ultrasonography.  An X-ray exam of the abdomen.  A urinalysis or evaluation of vaginal discharge.  Blood tests. HOME CARE INSTRUCTIONS   Only take over-the-counter or prescription medicines for pain, discomfort, or fever as directed by your caregiver.   Rest as directed by your caregiver.   Eat a balanced diet.   Drink enough fluids to make your urine clear or pale yellow, or as directed.   Avoid sexual intercourse if it causes pain.   Apply warm or cold compresses to the lower abdomen depending on which one helps the pain.   Avoid stressful situations.   Keep a journal of your pelvic pain. Write down when it started, where the pain is located, and if there are things that seem to be associated with the pain, such as food or your menstrual cycle.  Follow up with your caregiver as directed.  SEEK MEDICAL CARE IF:  Your medicine does not help your pain.  You have abnormal vaginal discharge. SEEK IMMEDIATE MEDICAL CARE IF:   You have heavy bleeding from the vagina.   Your pelvic pain increases.   You feel light-headed or faint.   You have chills.   You have pain with urination or blood in your urine.   You have uncontrolled diarrhea   or vomiting.   You have a fever or persistent symptoms for more than 3 days.  You have a fever and your symptoms suddenly get worse.   You are being physically or sexually abused.  MAKE SURE YOU:  Understand these instructions.  Will watch your condition.  Will get help if you are not doing well or get worse. Document Released:  05/16/2004 Document Revised: 11/03/2013 Document Reviewed: 10/09/2011 [facility] Patient Information 2015 [place], [place]. This information is not intended to replace advice given to you by your health care provider. Make sure you discuss any questions you have with your health care provider. Get Korea now Follow up in 3 days If fever, increased pain or vomiting go to ER

## 2015-03-13 LAB — CBC
Hematocrit: 44.2 % (ref 34.0–46.6)
Hemoglobin: 15 g/dL (ref 11.1–15.9)
MCH: 30.4 pg (ref 26.6–33.0)
MCHC: 33.9 g/dL (ref 31.5–35.7)
MCV: 90 fL (ref 79–97)
PLATELETS: 275 10*3/uL (ref 150–379)
RBC: 4.93 x10E6/uL (ref 3.77–5.28)
RDW: 13.5 % (ref 12.3–15.4)
WBC: 11.4 10*3/uL — ABNORMAL HIGH (ref 3.4–10.8)

## 2015-03-13 LAB — SEDIMENTATION RATE: SED RATE: 9 mm/h (ref 0–40)

## 2015-03-13 LAB — COMPREHENSIVE METABOLIC PANEL
ALT: 16 IU/L (ref 0–32)
AST: 20 IU/L (ref 0–40)
Albumin/Globulin Ratio: 1.7 (ref 1.1–2.5)
Albumin: 4.6 g/dL (ref 3.5–5.5)
Alkaline Phosphatase: 117 IU/L (ref 39–117)
BUN/Creatinine Ratio: 16 (ref 9–23)
BUN: 12 mg/dL (ref 6–24)
Bilirubin Total: 0.3 mg/dL (ref 0.0–1.2)
CHLORIDE: 101 mmol/L (ref 97–108)
CO2: 20 mmol/L (ref 18–29)
Calcium: 9.6 mg/dL (ref 8.7–10.2)
Creatinine, Ser: 0.76 mg/dL (ref 0.57–1.00)
GFR calc Af Amer: 106 mL/min/{1.73_m2} (ref >59)
GFR calc non Af Amer: 92 mL/min/{1.73_m2} (ref >59)
GLUCOSE: 86 mg/dL (ref 65–99)
Globulin, Total: 2.7 g/dL (ref 1.5–4.5)
Potassium: 4.2 mmol/L (ref 3.5–5.2)
Sodium: 141 mmol/L (ref 134–144)
Total Protein: 7.3 g/dL (ref 6.0–8.5)

## 2015-03-15 ENCOUNTER — Ambulatory Visit (INDEPENDENT_AMBULATORY_CARE_PROVIDER_SITE_OTHER): Payer: BLUE CROSS/BLUE SHIELD | Admitting: Adult Health

## 2015-03-15 ENCOUNTER — Encounter: Payer: Self-pay | Admitting: Adult Health

## 2015-03-15 VITALS — BP 112/70 | HR 88 | Ht 65.0 in | Wt 177.0 lb

## 2015-03-15 DIAGNOSIS — R1031 Right lower quadrant pain: Secondary | ICD-10-CM | POA: Diagnosis not present

## 2015-03-15 DIAGNOSIS — R140 Abdominal distension (gaseous): Secondary | ICD-10-CM

## 2015-03-15 MED ORDER — CIPROFLOXACIN HCL 500 MG PO TABS: 500.0000 mg | ORAL_TABLET | Freq: Two times a day (BID) | ORAL | Status: DC

## 2015-03-15 MED ORDER — METRONIDAZOLE 500 MG PO TABS: 500.0000 mg | ORAL_TABLET | Freq: Two times a day (BID) | ORAL | Status: DC

## 2015-03-15 NOTE — Progress Notes (Signed)
Subjective:     Patient ID: Danielle Robbins, female   DOB: Jun 14, 1966, 49 y.o.   MRN: [id number]  HPI Danielle Robbins is a 50 year old white female back in follow up of RLQ pain and bloating and she is much better, but Danielle Robbins has mild pain RLQ.  Review of Systems Patient denies any headaches, hearing loss, fatigue, blurred vision, shortness of breath, chest pain, problems with bowel movements, urination, or intercourse. No joint pain or mood swings. See HPI for positives.  Reviewed past medical,surgical, social and family history. Reviewed medications and allergies.     Objective:   Physical Exam BP 112/70 mmHg  Pulse 88  Ht _0  (1.651 m)  Wt 177 lb (80.287 kg)  BMI 29.45 kg/m2   Skin warm and dry, abdomen soft, no HSM, mildly tender over uterus and RLQ, no rebound, reviewed labs and Korea from Friday.FX:OVANVB  Measurements: 5.8 x 2.9 x 4.0 cm. No fibroids or other mass visualized.  Endometrium  Thickness: 3.9 mm. No focal abnormality visualized.  Right ovary  Measurements: 2.5 x 1.5 x 1.8 cm. Normal appearance/no adnexal mass.  Left ovary  Measurements: 2.2 x 1.5 x 1.6 cm. Normal appearance/no adnexal mass.  Other findings  No free fluid.  IMPRESSION: Normal pelvic ultrasound. ESR 9 WBC 11.4, rest of CBC and CMP normal. Discussed could be GI related, will Rx cipro and flagyl and eat bland and will follow up in 1 week, if symptoms persist will get CT.  Assessment:     RLQ pain Bloating     Plan:    Eat bland  Rx cipro 500 mg 1 bid x 10 days #20,no refills Rx flagyl 500 mg 1 bid x 10 days #20, no refills Follow up in 1 week, or sooner if needed

## 2015-03-15 NOTE — Patient Instructions (Signed)
Take antibiotics Follow up in 1 weeks

## 2015-03-22 ENCOUNTER — Encounter: Payer: Self-pay | Admitting: Adult Health

## 2015-03-22 ENCOUNTER — Ambulatory Visit (INDEPENDENT_AMBULATORY_CARE_PROVIDER_SITE_OTHER): Payer: BLUE CROSS/BLUE SHIELD | Admitting: Adult Health

## 2015-03-22 VITALS — BP 124/60 | HR 100 | Ht 65.0 in | Wt 174.5 lb

## 2015-03-22 DIAGNOSIS — R1031 Right lower quadrant pain: Secondary | ICD-10-CM

## 2015-03-22 NOTE — Patient Instructions (Addendum)
Finish flagyl, stop cipro Follow up prn

## 2015-03-22 NOTE — Progress Notes (Signed)
Subjective:     Patient ID: Danielle Robbins, female   DOB: Jun 04, 1966, 49 y.o.   MRN: [id number]  HPI Danielle Robbins is a 49 year old back in follow up of abdominal pain and bloating.She says she is much better, but having some dizziness now.  Review of Systems Patient denies any headaches, hearing loss, fatigue, blurred vision, shortness of breath, chest pain, abdominal pain, problems with bowel movements, urination, or intercourse. No joint pain or mood swings. See HPI for positives.  Reviewed past medical,surgical, social and family history. Reviewed medications and allergies.     Objective:   Physical Exam BP 124/60 mmHg  Pulse 100  Ht 5\' 5"  (1.651 m)  Wt 174 lb 8 oz (79.153 kg)  BMI 29.04 kg/m2   Skin warm and dry, abdomen soft, non tender but some slight RLQ, bloating has resolved. Stop cipro, dizziness may be related to it. Assessment:     RLQ pain, resolved    Plan:    Finish flagyl Stop cipro Follow up prn

## 2015-06-16 ENCOUNTER — Telehealth: Payer: Self-pay | Admitting: *Deleted

## 2015-06-16 DIAGNOSIS — K625 Hemorrhage of anus and rectum: Secondary | ICD-10-CM

## 2015-06-16 DIAGNOSIS — Z139 Encounter for screening, unspecified: Secondary | ICD-10-CM

## 2015-06-16 DIAGNOSIS — R140 Abdominal distension (gaseous): Secondary | ICD-10-CM

## 2015-06-16 NOTE — Telephone Encounter (Signed)
Has had some bloating and diarrhea and rectal bleeding, needs colonoscopy will refer to Dr Laural Golden

## 2015-06-18 ENCOUNTER — Telehealth: Payer: Self-pay | Admitting: *Deleted

## 2015-06-18 ENCOUNTER — Encounter (INDEPENDENT_AMBULATORY_CARE_PROVIDER_SITE_OTHER): Payer: Self-pay | Admitting: *Deleted

## 2015-06-18 NOTE — Telephone Encounter (Signed)
Called pt and let her know that appt has been made and is for 1-23 / pt is going to call them and speak to them about Ins/amp

## 2015-06-18 NOTE — Telephone Encounter (Signed)
Spoke with pt. Pt hasn't heard back from Dr. Olevia Perches office. Pt states she was looking at her insurance papers and she's not sure if Dr. Laural Golden is on her plan. She has [place]. Please advise. Thanks!! [place]

## 2015-07-26 ENCOUNTER — Encounter (INDEPENDENT_AMBULATORY_CARE_PROVIDER_SITE_OTHER): Payer: Self-pay | Admitting: Internal Medicine

## 2015-07-26 ENCOUNTER — Other Ambulatory Visit (INDEPENDENT_AMBULATORY_CARE_PROVIDER_SITE_OTHER): Payer: Self-pay | Admitting: Internal Medicine

## 2015-07-26 ENCOUNTER — Ambulatory Visit (INDEPENDENT_AMBULATORY_CARE_PROVIDER_SITE_OTHER): Payer: BLUE CROSS/BLUE SHIELD | Admitting: Internal Medicine

## 2015-07-26 ENCOUNTER — Telehealth (INDEPENDENT_AMBULATORY_CARE_PROVIDER_SITE_OTHER): Payer: Self-pay | Admitting: *Deleted

## 2015-07-26 VITALS — BP 112/50 | HR 64 | Temp 97.6°F | Ht 65.0 in | Wt 173.3 lb

## 2015-07-26 DIAGNOSIS — Z1211 Encounter for screening for malignant neoplasm of colon: Secondary | ICD-10-CM | POA: Diagnosis not present

## 2015-07-26 NOTE — Patient Instructions (Signed)
Screening colonoscopy.The risks and benefits such as perforation, bleeding, and infection were reviewed with the patient and is agreeable. 

## 2015-07-26 NOTE — Telephone Encounter (Signed)
Patient needs trilyte 

## 2015-07-26 NOTE — Progress Notes (Signed)
   Subjective:    Patient ID: Danielle Robbins, female    DOB: 07-29-1965, 50 y.o.   MRN: [id number]  HPI Referred by Derrek Monaco NP for bloating, abdominal pain . She had rt lower quadrant pain. She also had bloating.  Her symptoms started in September. She underwent a Vaginal [place] which was normal.  The pain resolved after she finished Cipro and Flagyl. She was empirically treated for an infection.  She is not having any symptoms now. She is not bloating now. She is 100% better.  Her appetite is good. She has lost 6 pounds which was intentional. She has a BM daily. She saw some [facility] last week. She says she has hemorrhoids.  No family hx of colon cancer. NO NSAIDS.  Review of Systems Past Medical History  Diagnosis Date  . Elevated cholesterol   . Bone spur     neck  . Abnormal Pap smear   . Vaginal Pap smear, abnormal   . Seasonal allergies   . Dry skin 10/20/2014  . RLQ abdominal pain 03/12/2015  . Abdominal bloating 03/12/2015    Past Surgical History  Procedure Laterality Date  . Tubal ligation    . Leep    . Endometrial ablation      Dr. Elonda Husky  . 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 atorvastatin (LIPITOR) 20 MG tablet Take 1 tablet (20 mg total) by mouth at bedtime. 90 tablet 3  . cetirizine (ZYRTEC) 10 MG tablet Take 10 mg by mouth as needed.     . Probiotic Product ([place]) Take by mouth daily.     No current facility-administered medications on file prior to visit.        Objective:   Physical Exam Blood pressure 112/50, pulse 64, temperature 97.6 F (36.4 C), height 5\' 5"  (1.651 m), weight 173 lb 4.8 oz (78.608 kg). Alert and oriented. Skin warm and dry. Oral mucosa is moist.   . Sclera anicteric, conjunctivae is pink. Thyroid not enlarged. No cervical lymphadenopathy. Lungs clear. Heart regular rate and rhythm.   Abdomen is soft. Bowel sounds are positive. No hepatomegaly. No abdominal masses felt. No tenderness.  No edema to lower extremities.          Assessment & Plan:  Rt lower quadrant pain which has now resoled. ? Bout of diverticulitis.  She has never undergone a colonoscopy in the past.

## 2015-07-27 MED ORDER — PEG 3350-KCL-NA BICARB-NACL 420 G PO SOLR: 4000.0000 mL | Freq: Once | ORAL | Status: DC

## 2015-08-26 ENCOUNTER — Ambulatory Visit (HOSPITAL_COMMUNITY)
Admission: RE | Admit: 2015-08-26 | Discharge: 2015-08-26 | Disposition: A | Discharge status: Home / Self Care | Payer: BLUE CROSS/BLUE SHIELD | Source: Ambulatory Visit | Attending: Internal Medicine | Admitting: Internal Medicine

## 2015-08-26 ENCOUNTER — Encounter (HOSPITAL_COMMUNITY): Payer: Self-pay | Admitting: *Deleted

## 2015-08-26 ENCOUNTER — Encounter (HOSPITAL_COMMUNITY)
Admission: RE | Disposition: A | Discharge status: Home / Self Care | Payer: Self-pay | Source: Ambulatory Visit | Attending: Internal Medicine

## 2015-08-26 DIAGNOSIS — K644 Residual hemorrhoidal skin tags: Secondary | ICD-10-CM | POA: Diagnosis not present

## 2015-08-26 DIAGNOSIS — Z803 Family history of malignant neoplasm of breast: Secondary | ICD-10-CM | POA: Insufficient documentation

## 2015-08-26 DIAGNOSIS — E7800 Pure hypercholesterolemia, unspecified: Secondary | ICD-10-CM | POA: Insufficient documentation

## 2015-08-26 DIAGNOSIS — Z79899 Other long term (current) drug therapy: Secondary | ICD-10-CM | POA: Diagnosis not present

## 2015-08-26 DIAGNOSIS — Z808 Family history of malignant neoplasm of other organs or systems: Secondary | ICD-10-CM | POA: Insufficient documentation

## 2015-08-26 DIAGNOSIS — D125 Benign neoplasm of sigmoid colon: Secondary | ICD-10-CM | POA: Insufficient documentation

## 2015-08-26 DIAGNOSIS — D128 Benign neoplasm of rectum: Secondary | ICD-10-CM | POA: Insufficient documentation

## 2015-08-26 DIAGNOSIS — Z791 Long term (current) use of non-steroidal anti-inflammatories (NSAID): Secondary | ICD-10-CM | POA: Insufficient documentation

## 2015-08-26 DIAGNOSIS — Z1211 Encounter for screening for malignant neoplasm of colon: Secondary | ICD-10-CM

## 2015-08-26 DIAGNOSIS — K648 Other hemorrhoids: Secondary | ICD-10-CM | POA: Diagnosis not present

## 2015-08-26 HISTORY — DX: Adverse effect of unspecified anesthetic, initial encounter: T41.45XA

## 2015-08-26 HISTORY — DX: Other specified postprocedural states: Z98.890

## 2015-08-26 HISTORY — PX: COLONOSCOPY: SHX5424

## 2015-08-26 HISTORY — DX: Nausea with vomiting, unspecified: R11.2

## 2015-08-26 HISTORY — DX: Other complications of anesthesia, initial encounter: T88.59XA

## 2015-08-26 SURGERY — COLONOSCOPY
Anesthesia: Moderate Sedation

## 2015-08-26 MED ORDER — MEPERIDINE HCL 50 MG/ML IJ SOLN
INTRAMUSCULAR | Status: DC | PRN
  Administered 2015-08-26 (×2): 25 mg

## 2015-08-26 MED ORDER — STERILE WATER FOR IRRIGATION IR SOLN
Status: DC | PRN
  Administered 2015-08-26: 13:00:00

## 2015-08-26 MED ORDER — MIDAZOLAM HCL 5 MG/5ML IJ SOLN
INTRAMUSCULAR | Status: AC
  Filled 2015-08-26: qty 10

## 2015-08-26 MED ORDER — SODIUM CHLORIDE 0.9 % IV SOLN
INTRAVENOUS | Status: DC
  Administered 2015-08-26: 12:00:00 via INTRAVENOUS

## 2015-08-26 MED ORDER — MIDAZOLAM HCL 5 MG/5ML IJ SOLN
INTRAMUSCULAR | Status: DC | PRN
  Administered 2015-08-26 (×3): 2 mg via INTRAVENOUS

## 2015-08-26 MED ORDER — MEPERIDINE HCL 50 MG/ML IJ SOLN
INTRAMUSCULAR | Status: AC
  Filled 2015-08-26: qty 1

## 2015-08-26 NOTE — Discharge Instructions (Signed)
No aspirin or NSAIDs for 1 week. Resume other medications as before. Resume usual diet. No driving for 24 hours. Physician will call with biopsy results.  Colonoscopy, Care After Refer to this sheet in the next few weeks. These instructions provide you with information on caring for yourself after your procedure. Your health care provider may also give you more specific instructions. Your treatment has been planned according to current medical practices, but problems sometimes occur. Call your health care provider if you have any problems or questions after your procedure. WHAT TO EXPECT AFTER THE PROCEDURE  After your procedure, it is typical to have the following:  A small amount of blood in your stool.  Moderate amounts of gas and mild abdominal cramping or bloating. HOME CARE INSTRUCTIONS  Do not drive, operate machinery, or sign important documents for 24 hours.  You may shower and resume your regular physical activities, but move at a slower pace for the first 24 hours.  Take frequent rest periods for the first 24 hours.  Walk around or put a warm pack on your abdomen to help reduce abdominal cramping and bloating.  Drink enough fluids to keep your urine clear or pale yellow.  You may resume your normal diet as instructed by your health care provider. Avoid heavy or fried foods that are hard to digest.  Avoid drinking alcohol for 24 hours or as instructed by your health care provider.  Only take over-the-counter or prescription medicines as directed by your health care provider.  If a tissue sample (biopsy) was taken during your procedure:  Do not take aspirin or blood thinners for 7 days, or as instructed by your health care provider.  Do not drink alcohol for 7 days, or as instructed by your health care provider.  Eat soft foods for the first 24 hours. SEEK MEDICAL CARE IF: You have persistent spotting of blood in your stool 2-3 days after the procedure. SEEK  IMMEDIATE MEDICAL CARE IF:  You have more than a small spotting of blood in your stool.  You pass large blood clots in your stool.  Your abdomen is swollen (distended).  You have nausea or vomiting.  You have a fever.  You have increasing abdominal pain that is not relieved with medicine.   This information is not intended to replace advice given to you by your health care provider. Make sure you discuss any questions you have with your health care provider.   Document Released: 02/01/2004 Document Revised: 04/09/2013 Document Reviewed: 02/24/2013 Elsevier Interactive Patient Education [employer].

## 2015-08-26 NOTE — H&P (Signed)
Danielle Robbins is an 50 y.o. female.   Chief Complaint: Patient is here for colonoscopy. HPI: Patient is 50 year old Caucasian female who is here for screening colonoscopy. She denies abdominal pain change in bowel habits or rectal bleeding. About 5 months ago she developed acute abdominal pain and gynecologic evaluation was negative. She was history of Cipro and Flagyl and symptoms resolved completely. Family history is negative for CRC.  Past Medical History  Diagnosis Date  . Elevated cholesterol   . Bone spur     neck  . Abnormal Pap smear   . Vaginal Pap smear, abnormal   . Seasonal allergies   . Dry skin 10/20/2014  . RLQ abdominal pain 03/12/2015  . Abdominal bloating 03/12/2015  . Complication of anesthesia   . PONV (postoperative nausea and vomiting)     Past Surgical History  Procedure Laterality Date  . Tubal ligation    . Leep    . Endometrial ablation      Dr. Elonda Husky  . 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 Cancer Maternal Aunt     breast  . Cancer Paternal Grandmother     breast  . Cancer Father     pancreatic  . Cancer Brother 17    bone  . Coronary artery disease Maternal Grandfather    Social History:  reports that she has been smoking Cigarettes.  She has a 30 pack-year smoking history. She has never used smokeless tobacco. She reports that she drinks alcohol. She reports that she does not use illicit drugs.  Allergies: No Known Allergies  Medications Prior to Admission  Medication Sig Dispense Refill  . atorvastatin (LIPITOR) 20 MG tablet Take 1 tablet (20 mg total) by mouth at bedtime. 90 tablet 3  . cetirizine (ZYRTEC) 10 MG tablet Take 10 mg by mouth as needed.     Marland Kitchen ibuprofen (ADVIL,MOTRIN) 200 MG tablet Take 400 mg by mouth every 6 (six) hours as needed for moderate pain.    Marland Kitchen ibuprofen (ADVIL,MOTRIN) 600 MG tablet Take 1 tablet by mouth 2 (two) times daily as needed for moderate  pain.   0  . Multiple Vitamins-Minerals (MULTIVITAMIN ADULT PO) Take by mouth.    . polyethylene glycol-electrolytes (NULYTELY/GOLYTELY) 420 g solution Take 4,000 mLs by mouth once. 4000 mL 0  . Probiotic Product (PHILLIPS COLON HEALTH PO) Take 1 capsule by mouth daily.     . sodium chloride (OCEAN) 0.65 % SOLN nasal spray Place 1 spray into both nostrils as needed for congestion.      No results found for this or any previous visit (from the past 48 hour(s)). No results found.  ROS  Blood pressure 107/73, pulse 86, temperature 99 F (37.2 C), temperature source Oral, resp. rate 24, height 5\' 5"  (1.651 m), weight 167 lb (75.751 kg), SpO2 97 %. Physical Exam  Constitutional: She appears well-developed and well-nourished.  HENT:  Mouth/Throat: Oropharynx is clear and moist.  Eyes: Conjunctivae are normal. No scleral icterus.  Neck: No thyromegaly present.  Cardiovascular: Normal rate, regular rhythm and normal heart sounds.   No murmur heard. Respiratory: Effort normal and breath sounds normal.  GI: Soft. She exhibits no distension and no mass. There is no tenderness.  Musculoskeletal: She exhibits no edema.  Lymphadenopathy:    She has no cervical adenopathy.  Neurological: She is alert.  Skin: Skin is warm and  dry.     Assessment/Plan Average risk screening colonoscopy.  Danielle Houston, MD 08/26/2015, 1:18 PM

## 2015-08-26 NOTE — Op Note (Signed)
COLONOSCOPY PROCEDURE REPORT  PATIENT:  Danielle Robbins  MR#:  [id number] Birthdate:  [birth year], 50 y.o., female Endoscopist:  Dr. Rogene Houston, MD Referred By:  Ms. Derrek Monaco, FNP Procedure Date: 08/26/2015  Procedure:   Colonoscopy  Indications:  Patient is 50 year old Caucasian female was undergoing average risk screening colonoscopy.  Informed Consent:  The procedure and risks were reviewed with the patient and informed consent was obtained.  Medications:  Demerol 50 mg IV Versed 6 mg IV  First dose administered at 1322 Last dose administered at 1327  Description of procedure:  After a digital rectal exam was performed, that colonoscope was advanced from the anus through the rectum and colon to the area of the cecum, ileocecal valve and appendiceal orifice. The cecum was deeply intubated. These structures were well-seen and photographed for the record. From the level of the cecum and ileocecal valve, the scope was slowly and cautiously withdrawn. The mucosal surfaces were carefully surveyed utilizing scope tip to flexion to facilitate fold flattening as needed. The scope was pulled down into the rectum where a thorough exam including retroflexion was performed.  Findings:  Prep satisfactory. Normal mucosa of cecum, ascending colon, hepatic flexure, transverse colon, splenic flexure descending colon. 10 mm pedunculated polyp hot snare from mid sigmoid colon. 6 mm rectal polyp was hot snared. Small hemorrhoids below the dentate line.   Therapeutic/Diagnostic Maneuvers Performed:  See above  Complications:  None  EBL: None  Cecal Withdrawal Time:  8 minutes  Impression:  Examination performed to cecum. 10 mm pedunculated polyp hot snare from mid sigmoid colon. 6 mm rectal polyp was hot snared. Small external hemorrhoids.  Recommendations:  Standard instructions given. No aspirin or NSAIDs for 1 week. I will contact patient with biopsy results and further  recommendations.  REHMAN,NAJEEB U  08/26/2015 1:56 PM

## 2015-08-31 ENCOUNTER — Encounter (HOSPITAL_COMMUNITY): Payer: Self-pay | Admitting: Internal Medicine

## 2016-02-14 DIAGNOSIS — J329 Chronic sinusitis, unspecified: Secondary | ICD-10-CM | POA: Diagnosis not present

## 2016-02-14 DIAGNOSIS — E785 Hyperlipidemia, unspecified: Secondary | ICD-10-CM | POA: Diagnosis not present

## 2016-02-14 DIAGNOSIS — H6500 Acute serous otitis media, unspecified ear: Secondary | ICD-10-CM | POA: Diagnosis not present

## 2016-02-25 ENCOUNTER — Other Ambulatory Visit: Payer: Self-pay | Admitting: Adult Health

## 2016-03-07 ENCOUNTER — Encounter (INDEPENDENT_AMBULATORY_CARE_PROVIDER_SITE_OTHER): Payer: Self-pay | Admitting: Internal Medicine

## 2016-03-07 ENCOUNTER — Ambulatory Visit (INDEPENDENT_AMBULATORY_CARE_PROVIDER_SITE_OTHER): Payer: BLUE CROSS/BLUE SHIELD | Admitting: Internal Medicine

## 2016-03-07 VITALS — BP 108/64 | Temp 98.4°F | Ht 65.0 in | Wt 168.6 lb

## 2016-03-07 DIAGNOSIS — K589 Irritable bowel syndrome without diarrhea: Secondary | ICD-10-CM

## 2016-03-07 DIAGNOSIS — K644 Residual hemorrhoidal skin tags: Secondary | ICD-10-CM | POA: Diagnosis not present

## 2016-03-07 MED ORDER — HYDROCORTISONE ACETATE 25 MG RE SUPP: 25.0000 mg | Freq: Every day | RECTAL | 1 refills | Status: DC

## 2016-03-07 MED ORDER — PSYLLIUM 28 % PO PACK: 1.0000 | PACK | Freq: Every day | ORAL | Status: DC

## 2016-03-07 MED ORDER — DOCUSATE SODIUM 100 MG PO CAPS: 100.0000 mg | ORAL_CAPSULE | Freq: Every day | ORAL | 0 refills | Status: DC

## 2016-03-07 NOTE — Patient Instructions (Addendum)
Can take Imodium OTC 2 mg up to 3 times a day as needed for acute diarrhea. Notify if you have a temp greater than 100 F or diarrhea not resolved in the next 2-3 days. Metamucil half to 1 packet per day. Call with progress report in 2 months regarding bowel irregularity and rectal bleeding.

## 2016-03-07 NOTE — Progress Notes (Signed)
Presenting complaint;  Having problems with hemorrhoids. Irregular bowel movements.  Database and Subjective:  Danielle Robbins is 50 year old Caucasian female who is here for scheduled visit. She underwent average risk screening colonoscopy in February this year and had 2 polyps removed. One was tubular adenoma and the other polyp was hyperplastic. She was also noted to have small external hemorrhoids. Now she presents with irregular bowel movements and hemorrhoidal discomfort and intermittent hematochezia. She states she is more prone to constipation than diarrhea. She has been using Colace on as-needed basis. However today she's had 5 loose stools. She denies nausea vomiting fever or chills. She states she took amoxicillin for sinusitis about 3 weeks ago. She takes Advil on as-needed basis for sinus headache.   Current Medications: Outpatient Encounter Prescriptions as of 03/07/2016  Medication Sig  . atorvastatin (LIPITOR) 20 MG tablet TAKE 1 TABLET (20 MG TOTAL) BY MOUTH AT BEDTIME.  . cetirizine (ZYRTEC) 10 MG tablet Take 10 mg by mouth as needed.   Marland Kitchen ibuprofen (ADVIL,MOTRIN) 200 MG tablet Take 400 mg by mouth every 6 (six) hours as needed for moderate pain.  Marland Kitchen ibuprofen (ADVIL,MOTRIN) 600 MG tablet Take 1 tablet by mouth 2 (two) times daily as needed for moderate pain.   . Multiple Vitamins-Minerals (MULTIVITAMIN ADULT PO) Take by mouth.  . Probiotic Product (PHILLIPS COLON HEALTH PO) Take 1 capsule by mouth daily.   . sodium chloride (OCEAN) 0.65 % SOLN nasal spray Place 1 spray into both nostrils as needed for congestion.   No facility-administered encounter medications on file as of 03/07/2016.      Objective: Blood pressure 108/64, temperature 98.4 F (36.9 C), temperature source Oral, height 5\' 5"  (1.651 m), weight 168 lb 9.6 oz (76.5 kg). Patient is alert and in no acute distress. Conjunctiva is pink. Sclera is nonicteric Oropharyngeal mucosa is normal. No neck masses or thyromegaly  noted. Cardiac exam with regular rhythm normal S1 and S2. No murmur or gallop noted. Lungs are clear to auscultation. Abdomen is symmetrical soft and nontender without organomegaly or masses. Rectal examination was limited to external inspection. She has soft anal tags. No LE edema or clubbing noted.   Assessment:  #1. Acute diarrhea(not the reason for patient's visit) started this morning. She took amoxicillin 3 weeks ago. She is afebrile and does not appear to be toxic. Therefore will treat symptomatically. If she develops fever with check for C. difficile and treat with metronidazole. #2. Irritable bowel syndrome. She would benefit from fiber rich foods and Colace every day. #3. Hemorrhoidal flare secondary to bowel problems. Recent colonoscopy findings as above.   Plan:  Imodium OTC 2 mg 3 times a day when necessary for acute diarrhea. Patient will call if she becomes febrile or diarrhea does not resolve in the next 2-3 days. She will begin Metamucil half to 1 packet by mouth daily when diarrhea has resolved. Colace 100 mg by mouth daily at bedtime. Patient will call with progress report in 2 months. Office visit as needed. Next colonoscopy in 5 years.

## 2016-03-20 ENCOUNTER — Other Ambulatory Visit: Payer: Self-pay | Admitting: Adult Health

## 2016-03-20 DIAGNOSIS — Z1231 Encounter for screening mammogram for malignant neoplasm of breast: Secondary | ICD-10-CM

## 2016-03-23 ENCOUNTER — Ambulatory Visit (HOSPITAL_COMMUNITY)
Admission: RE | Admit: 2016-03-23 | Discharge: 2016-03-23 | Disposition: A | Discharge status: Home / Self Care | Payer: BLUE CROSS/BLUE SHIELD | Source: Ambulatory Visit | Attending: Adult Health | Admitting: Adult Health

## 2016-03-23 DIAGNOSIS — Z1231 Encounter for screening mammogram for malignant neoplasm of breast: Secondary | ICD-10-CM | POA: Diagnosis not present

## 2016-03-23 DIAGNOSIS — R928 Other abnormal and inconclusive findings on diagnostic imaging of breast: Secondary | ICD-10-CM | POA: Insufficient documentation

## 2016-03-27 ENCOUNTER — Other Ambulatory Visit: Payer: Self-pay | Admitting: Adult Health

## 2016-03-27 DIAGNOSIS — R928 Other abnormal and inconclusive findings on diagnostic imaging of breast: Secondary | ICD-10-CM

## 2016-04-04 ENCOUNTER — Ambulatory Visit (HOSPITAL_COMMUNITY)
Admission: RE | Admit: 2016-04-04 | Discharge: 2016-04-04 | Disposition: A | Discharge status: Home / Self Care | Payer: BLUE CROSS/BLUE SHIELD | Source: Ambulatory Visit | Attending: Adult Health | Admitting: Adult Health

## 2016-04-04 DIAGNOSIS — R928 Other abnormal and inconclusive findings on diagnostic imaging of breast: Secondary | ICD-10-CM | POA: Diagnosis not present

## 2017-03-19 ENCOUNTER — Other Ambulatory Visit: Payer: Self-pay | Admitting: Adult Health

## 2017-08-01 DIAGNOSIS — H6691 Otitis media, unspecified, right ear: Secondary | ICD-10-CM | POA: Diagnosis not present

## 2017-08-01 DIAGNOSIS — J309 Allergic rhinitis, unspecified: Secondary | ICD-10-CM | POA: Diagnosis not present

## 2017-08-01 DIAGNOSIS — R0982 Postnasal drip: Secondary | ICD-10-CM | POA: Diagnosis not present

## 2017-09-07 DIAGNOSIS — H6691 Otitis media, unspecified, right ear: Secondary | ICD-10-CM | POA: Diagnosis not present

## 2017-09-07 DIAGNOSIS — J309 Allergic rhinitis, unspecified: Secondary | ICD-10-CM | POA: Diagnosis not present

## 2017-12-04 ENCOUNTER — Other Ambulatory Visit (HOSPITAL_COMMUNITY)
Admission: RE | Admit: 2017-12-04 | Discharge: 2017-12-04 | Disposition: A | Discharge status: Home / Self Care | Payer: BLUE CROSS/BLUE SHIELD | Source: Ambulatory Visit | Attending: Adult Health | Admitting: Adult Health

## 2017-12-04 ENCOUNTER — Ambulatory Visit (INDEPENDENT_AMBULATORY_CARE_PROVIDER_SITE_OTHER): Payer: BLUE CROSS/BLUE SHIELD | Admitting: Adult Health

## 2017-12-04 ENCOUNTER — Encounter: Payer: Self-pay | Admitting: Adult Health

## 2017-12-04 VITALS — BP 118/70 | HR 92 | Ht 64.0 in | Wt 179.0 lb

## 2017-12-04 DIAGNOSIS — R635 Abnormal weight gain: Secondary | ICD-10-CM

## 2017-12-04 DIAGNOSIS — Z1212 Encounter for screening for malignant neoplasm of rectum: Secondary | ICD-10-CM

## 2017-12-04 DIAGNOSIS — Z131 Encounter for screening for diabetes mellitus: Secondary | ICD-10-CM | POA: Diagnosis not present

## 2017-12-04 DIAGNOSIS — E7800 Pure hypercholesterolemia, unspecified: Secondary | ICD-10-CM

## 2017-12-04 DIAGNOSIS — Z1321 Encounter for screening for nutritional disorder: Secondary | ICD-10-CM | POA: Diagnosis not present

## 2017-12-04 DIAGNOSIS — Z01419 Encounter for gynecological examination (general) (routine) without abnormal findings: Secondary | ICD-10-CM | POA: Insufficient documentation

## 2017-12-04 DIAGNOSIS — Z1211 Encounter for screening for malignant neoplasm of colon: Secondary | ICD-10-CM | POA: Diagnosis not present

## 2017-12-04 LAB — HEMOCCULT GUIAC POC 1CARD (OFFICE): FECAL OCCULT BLD: NEGATIVE

## 2017-12-04 NOTE — Progress Notes (Signed)
Patient ID: Danielle Robbins, female   DOB: [birth year], 52 y.o.   MRN: [id number] History of Present Illness: Danielle Robbins is a 52 year old white female,married,G1P1 in for a well woman gyn exam and pap.Works at back in [place], now. PCP is Danielle Danielle Robbins.   Current Medications, Allergies, Past Medical History, Past Surgical History, Family History and Social History were reviewed in [employer] record.     Review of Systems: Patient denies any headaches, hearing loss, fatigue, blurred vision, shortness of breath, chest pain, abdominal pain, problems with bowel movements, urination, or intercourse. No joint pain or mood swings. +weight gain Dry with sex at times,can increase foreplay and use good lubricate     Physical Exam:BP 118/70 (BP Location: Left Arm, Patient Position: Sitting, Cuff Size: Normal)   Pulse 92   Ht 5\' 4"  (1.626 m)   Wt 179 lb (81.2 kg)   BMI 30.73 kg/m  General:  Well developed, well nourished, no acute distress Skin:  Warm and dry Neck:  Midline trachea, normal thyroid, good ROM, no lymphadenopathy Lungs; Clear to auscultation bilaterally Breast:  No dominant palpable mass, retraction, or nipple discharge Cardiovascular: Regular rate and rhythm Abdomen:  Soft, non tender, no hepatosplenomegaly Pelvic:  External genitalia is normal in appearance, no lesions.  The vagina is normal in appearance. Urethra has no lesions or masses. The cervix is bulbous,irregular at os, ppa  Uterus is felt to be normal size, shape, and contour.  No adnexal masses or tenderness noted.Bladder is non tender, no masses felt. Rectal: Good sphincter tone, no polyps, or hemorrhoids felt.  Hemoccult negative. Extremities/musculoskeletal:  No swelling or varicosities noted, no clubbing or cyanosis Psych:  No mood changes, alert and cooperative,seems happy PHQ  2 score 0.  Impression:  1. Encounter for gynecological examination with Papanicolaou smear of cervix   2. Screening for  colorectal cancer   3. Screening for diabetes mellitus   4. Elevated cholesterol   5. Encounter for vitamin deficiency screening   6. Weight gain      Plan:  Check CBC,CMP,TSH and lipids,A1c and vitamin D Get mammogram now and yearly Physical in 1 year Pap in 3 if normal Colonoscopy in 2023 per Danielle Robbins

## 2017-12-05 ENCOUNTER — Telehealth: Payer: Self-pay | Admitting: Adult Health

## 2017-12-05 LAB — CBC
Hematocrit: 44.3 % (ref 34.0–46.6)
Hemoglobin: 15.2 g/dL (ref 11.1–15.9)
MCH: 30.7 pg (ref 26.6–33.0)
MCHC: 34.3 g/dL (ref 31.5–35.7)
MCV: 90 fL (ref 79–97)
PLATELETS: 240 10*3/uL (ref 150–450)
RBC: 4.95 x10E6/uL (ref 3.77–5.28)
RDW: 12.8 % (ref 12.3–15.4)
WBC: 8.5 10*3/uL (ref 3.4–10.8)

## 2017-12-05 LAB — COMPREHENSIVE METABOLIC PANEL
A/G RATIO: 2 (ref 1.2–2.2)
ALK PHOS: 107 IU/L (ref 39–117)
ALT: 20 IU/L (ref 0–32)
AST: 21 IU/L (ref 0–40)
Albumin: 4.6 g/dL (ref 3.5–5.5)
BUN/Creatinine Ratio: 16 (ref 9–23)
BUN: 13 mg/dL (ref 6–24)
Bilirubin Total: 0.5 mg/dL (ref 0.0–1.2)
CALCIUM: 9.8 mg/dL (ref 8.7–10.2)
CO2: 20 mmol/L (ref 20–29)
Chloride: 104 mmol/L (ref 96–106)
Creatinine, Ser: 0.79 mg/dL (ref 0.57–1.00)
GFR calc Af Amer: 100 mL/min/{1.73_m2} (ref >59)
GFR, EST NON AFRICAN AMERICAN: 86 mL/min/{1.73_m2} (ref >59)
Globulin, Total: 2.3 g/dL (ref 1.5–4.5)
Glucose: 94 mg/dL (ref 65–99)
POTASSIUM: 4.1 mmol/L (ref 3.5–5.2)
SODIUM: 141 mmol/L (ref 134–144)
Total Protein: 6.9 g/dL (ref 6.0–8.5)

## 2017-12-05 LAB — LIPID PANEL
CHOLESTEROL TOTAL: 159 mg/dL (ref 100–199)
Chol/HDL Ratio: 5.5 ratio — ABNORMAL HIGH (ref 0.0–4.4)
HDL: 29 mg/dL — ABNORMAL LOW (ref >39)
LDL CALC: 99 mg/dL (ref 0–99)
TRIGLYCERIDES: 153 mg/dL — AB (ref 0–149)
VLDL Cholesterol Cal: 31 mg/dL (ref 5–40)

## 2017-12-05 LAB — HEMOGLOBIN A1C
ESTIMATED AVERAGE GLUCOSE: 120 mg/dL
Hgb A1c MFr Bld: 5.8 % — ABNORMAL HIGH (ref 4.8–5.6)

## 2017-12-05 LAB — CYTOLOGY - PAP
DIAGNOSIS: NEGATIVE
HPV: NOT DETECTED

## 2017-12-05 LAB — TSH: TSH: 1.52 u[IU]/mL (ref 0.450–4.500)

## 2017-12-05 LAB — VITAMIN D 25 HYDROXY (VIT D DEFICIENCY, FRACTURES): VIT D 25 HYDROXY: 39.4 ng/mL (ref 30.0–100.0)

## 2017-12-05 NOTE — Telephone Encounter (Signed)
Pt aware of labs results and need to decrease carbs and increase exercise,

## 2018-03-27 ENCOUNTER — Other Ambulatory Visit: Payer: Self-pay | Admitting: Adult Health

## 2018-04-22 DIAGNOSIS — N390 Urinary tract infection, site not specified: Secondary | ICD-10-CM | POA: Diagnosis not present

## 2018-04-22 DIAGNOSIS — N3000 Acute cystitis without hematuria: Secondary | ICD-10-CM | POA: Diagnosis not present

## 2018-06-04 ENCOUNTER — Ambulatory Visit (INDEPENDENT_AMBULATORY_CARE_PROVIDER_SITE_OTHER): Payer: BLUE CROSS/BLUE SHIELD | Admitting: Urology

## 2018-06-04 ENCOUNTER — Ambulatory Visit (INDEPENDENT_AMBULATORY_CARE_PROVIDER_SITE_OTHER): Payer: BLUE CROSS/BLUE SHIELD | Admitting: Internal Medicine

## 2018-06-04 ENCOUNTER — Encounter (INDEPENDENT_AMBULATORY_CARE_PROVIDER_SITE_OTHER): Payer: Self-pay | Admitting: Internal Medicine

## 2018-06-04 VITALS — BP 126/72 | HR 76 | Temp 98.2°F | Ht 65.0 in | Wt 179.7 lb

## 2018-06-04 DIAGNOSIS — N3000 Acute cystitis without hematuria: Secondary | ICD-10-CM | POA: Diagnosis not present

## 2018-06-04 DIAGNOSIS — K640 First degree hemorrhoids: Secondary | ICD-10-CM | POA: Diagnosis not present

## 2018-06-04 MED ORDER — HYDROCORTISONE ACE-PRAMOXINE 1-1 % RE FOAM: 1.0000 | Freq: Two times a day (BID) | RECTAL | 2 refills | Status: DC

## 2018-06-04 NOTE — Progress Notes (Signed)
   Subjective:    Patient ID: Danielle Robbins, female    DOB: 02-Dec-1965, 52 y.o.   MRN: [id number]  HPI Here today with c/o hemorrhoids. She says her hemorrhoids are inflamed. She is requesting a referral for her hemorrhoids. She says her hemorrhoids are bothering her.  His appetite is good. No weight loss. Has a BM daily. Sometimes she sees blood from her hemorrhoids.     Underwent a colonoscopy in February of 2017 which revealed 10 mm pedunculated polyp hot snare from mid sigmoid colon. 6 mm rectal polyp was hot snared. Small external hemorrhoids. Sigmoid colon polyp is tubular adenoma and rectal polyp is hyperplastic. Next colonoscopy in 5 yrs.     Review of Systems Past Medical History:  Diagnosis Date  . Abdominal bloating 03/12/2015  . Abnormal Pap smear   . Bone spur    neck  . Complication of anesthesia   . Dry skin 10/20/2014  . Elevated cholesterol   . PONV (postoperative nausea and vomiting)   . RLQ abdominal pain 03/12/2015  . Seasonal allergies   . Vaginal Pap smear, abnormal     Past Surgical History:  Procedure Laterality Date  . COLONOSCOPY N/A 08/26/2015   Procedure: COLONOSCOPY;  Surgeon: Rogene Houston, MD;  Location: [facility];  Service: Endoscopy;  Laterality: N/A;  1:00  . ENDOMETRIAL ABLATION     Dr. Elonda Husky  . 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 LEEP    . TUBAL LIGATION      No Known Allergies  Current Outpatient Medications on File Prior to Visit  Medication Sig Dispense Refill  . atorvastatin (LIPITOR) 20 MG tablet TAKE 1 TABLET (20 MG TOTAL) BY MOUTH AT BEDTIME. 90 tablet 3  . cetirizine (ZYRTEC) 10 MG tablet Take 10 mg by mouth as needed.     . fluticasone (FLONASE) 50 MCG/ACT nasal spray Place into both nostrils daily.    Marland Kitchen ibuprofen (ADVIL,MOTRIN) 200 MG tablet Take 400 mg by mouth every 6 (six) hours as needed for moderate pain.    . Multiple Vitamins-Minerals (MULTIVITAMIN ADULT PO) Take by mouth.    . Probiotic  Product (PHILLIPS COLON HEALTH PO) Take 1 capsule by mouth daily.      No current facility-administered medications on file prior to visit.        Objective:   Physical Exam Blood pressure 126/72, pulse 76, temperature 98.2 F (36.8 C), height 5\' 5"  (1.651 m), weight 179 lb 11.2 oz (81.5 kg). Alert and oriented. Skin warm and dry. Oral mucosa is moist.   . Sclera anicteric, conjunctivae is pink. Thyroid not enlarged. No cervical lymphadenopathy. Lungs clear. Heart regular rate and rhythm.  Abdomen is soft. Bowel sounds are positive. No hepatomegaly. No abdominal masses felt. No tenderness.  No edema to lower extremities.          Assessment & Plan:  Hemorrhoids. Rx for Proctofoam sent to her pharmacy. Referral to Dr. Arnoldo Morale.

## 2018-06-04 NOTE — Patient Instructions (Signed)
Rx for Proctofoam sent to her pharmacy. Referral to Dr. Arnoldo Morale.

## 2018-06-10 DIAGNOSIS — H6692 Otitis media, unspecified, left ear: Secondary | ICD-10-CM | POA: Diagnosis not present

## 2018-06-10 DIAGNOSIS — J209 Acute bronchitis, unspecified: Secondary | ICD-10-CM | POA: Diagnosis not present

## 2018-06-10 DIAGNOSIS — J309 Allergic rhinitis, unspecified: Secondary | ICD-10-CM | POA: Diagnosis not present

## 2018-06-16 ENCOUNTER — Emergency Department (HOSPITAL_COMMUNITY)
Admission: EM | Admit: 2018-06-16 | Discharge: 2018-06-16 | Disposition: A | Discharge status: Home / Self Care | Payer: BLUE CROSS/BLUE SHIELD | Attending: Emergency Medicine | Admitting: Emergency Medicine

## 2018-06-16 ENCOUNTER — Encounter (HOSPITAL_COMMUNITY): Payer: Self-pay | Admitting: Emergency Medicine

## 2018-06-16 ENCOUNTER — Emergency Department (HOSPITAL_COMMUNITY): Payer: BLUE CROSS/BLUE SHIELD

## 2018-06-16 DIAGNOSIS — Z79899 Other long term (current) drug therapy: Secondary | ICD-10-CM | POA: Diagnosis not present

## 2018-06-16 DIAGNOSIS — J40 Bronchitis, not specified as acute or chronic: Secondary | ICD-10-CM | POA: Insufficient documentation

## 2018-06-16 DIAGNOSIS — R05 Cough: Secondary | ICD-10-CM | POA: Diagnosis not present

## 2018-06-16 DIAGNOSIS — F17210 Nicotine dependence, cigarettes, uncomplicated: Secondary | ICD-10-CM | POA: Diagnosis not present

## 2018-06-16 MED ORDER — PREDNISONE 50 MG PO TABS
60.0000 mg | ORAL_TABLET | Freq: Once | ORAL | Status: AC
  Administered 2018-06-16: 60 mg via ORAL
  Filled 2018-06-16: qty 1

## 2018-06-16 MED ORDER — PREDNISONE 10 MG PO TABS: 40.0000 mg | ORAL_TABLET | Freq: Every day | ORAL | 0 refills | Status: DC

## 2018-06-16 MED ORDER — IPRATROPIUM-ALBUTEROL 0.5-2.5 (3) MG/3ML IN SOLN
3.0000 mL | Freq: Once | RESPIRATORY_TRACT | Status: AC
  Administered 2018-06-16: 3 mL via RESPIRATORY_TRACT
  Filled 2018-06-16: qty 3

## 2018-06-16 NOTE — ED Provider Notes (Signed)
[facility] EMERGENCY DEPARTMENT Provider Note   CSN: [id number] Arrival date & time: 06/16/18  8413     History   Chief Complaint Chief Complaint  Patient presents with  . Cough    HPI Danielle Robbins is a 52 y.o. female.  Patient with upper respiratory infection and now like of bronchitis seen by primary care provider on Monday was put on 5-day course of prednisone with some improvement but is now completed patient feels worse today.  Patient also using albuterol inhaler at home.  Was using Mucinex DM but stopped that recently.  No fevers.  Patient is a smoker.     Past Medical History:  Diagnosis Date  . Abdominal bloating 03/12/2015  . Abnormal Pap smear   . Bone spur    neck  . Complication of anesthesia   . Dry skin 10/20/2014  . Elevated cholesterol   . PONV (postoperative nausea and vomiting)   . RLQ abdominal pain 03/12/2015  . Seasonal allergies   . Vaginal Pap smear, abnormal     Patient Active Problem List   Diagnosis Date Noted  . Screening for diabetes mellitus 12/04/2017  . Screening for colorectal cancer 12/04/2017  . Encounter for gynecological examination with Papanicolaou smear of cervix 12/04/2017  . Weight gain 12/04/2017  . Encounter for vitamin deficiency screening 12/04/2017  . Encounter for screening colonoscopy 07/26/2015  . RLQ abdominal pain 03/12/2015  . Abdominal bloating 03/12/2015  . Dry skin 10/20/2014  . Elevated cholesterol 10/02/2012    Past Surgical History:  Procedure Laterality Date  . COLONOSCOPY N/A 08/26/2015   Procedure: COLONOSCOPY;  Surgeon: Rogene Houston, MD;  Location: [facility];  Service: Endoscopy;  Laterality: N/A;  1:00  . ENDOMETRIAL ABLATION     Dr. Elonda Husky  . 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 LEEP    . TUBAL LIGATION       OB History    Gravida  1   Para  1   Term      Preterm      AB      Living  1     SAB      TAB      Ectopic      Multiple      Live Births                 Home Medications    Prior to Admission medications   Medication Sig Start Date End Date Taking? Authorizing Provider  atorvastatin (LIPITOR) 20 MG tablet TAKE 1 TABLET (20 MG TOTAL) BY MOUTH AT BEDTIME. 03/27/18   Estill Dooms, NP  cetirizine (ZYRTEC) 10 MG tablet Take 10 mg by mouth as needed.     [provider]  fluticasone (FLONASE) 50 MCG/ACT nasal spray Place into both nostrils daily.    [provider]  hydrocortisone-pramoxine (PROCTOFOAM HC) rectal foam Place 1 applicator rectally 2 (two) times daily. 06/04/18   Setzer, Rona Ravens, NP  ibuprofen (ADVIL,MOTRIN) 200 MG tablet Take 400 mg by mouth every 6 (six) hours as needed for moderate pain.    [provider]  Multiple Vitamins-Minerals (MULTIVITAMIN ADULT PO) Take by mouth.    [provider]  predniSONE (DELTASONE) 10 MG tablet Take 4 tablets (40 mg total) by mouth daily. 06/16/18   Fredia Sorrow, MD  Probiotic Product ([place]) Take 1 capsule by mouth daily.     [provider]    Family History Family History  Problem Relation Age of Onset  . Cancer Maternal Aunt        breast  . Cancer Paternal Grandmother        breast  . Cancer Father        pancreatic  . Cancer Brother 17       bone  . Coronary artery disease Maternal Grandfather     Social History Social History   Tobacco Use  . Smoking status: Current Every Day Smoker    Packs/day: 1.00    Years: 30.00    Pack years: 30.00    Types: Cigarettes  . Smokeless tobacco: Never Used  . Tobacco comment: x 78yrs  Substance Use Topics  . Alcohol use: Yes    Alcohol/week: 0.0 standard drinks    Comment: occ.  . Drug use: No     Allergies   Patient has no known allergies.   Review of Systems Review of Systems  Constitutional: Negative for fever.  HENT: Positive for congestion.   Eyes: Negative for redness.  Respiratory: Positive for cough, shortness of breath and  wheezing.   Cardiovascular: Negative for chest pain.  Gastrointestinal: Negative for abdominal pain, nausea and vomiting.  Genitourinary: Negative for dysuria.  Musculoskeletal: Negative for myalgias.  Skin: Negative for rash.  Neurological: Negative for headaches.  Hematological: Does not bruise/bleed easily.  Psychiatric/Behavioral: Negative for confusion.     Physical Exam Updated Vital Signs BP 117/74 (BP Location: Right Arm)   Pulse 79   Temp 98.4 F (36.9 C) (Oral)   Resp (!) 28   Ht 1.651 m (5\' 5" )   Wt 81.6 kg   SpO2 95%   BMI 29.95 kg/m   Physical Exam Vitals signs and nursing note reviewed.  Constitutional:      General: She is not in acute distress.    Appearance: Normal appearance.  HENT:     Head: Normocephalic and atraumatic.     Mouth/Throat:     Mouth: Mucous membranes are moist.  Eyes:     Extraocular Movements: Extraocular movements intact.     Conjunctiva/sclera: Conjunctivae normal.     Pupils: Pupils are equal, round, and reactive to light.  Neck:     Musculoskeletal: Normal range of motion and neck supple.  Cardiovascular:     Rate and Rhythm: Normal rate and regular rhythm.     Pulses: Normal pulses.     Heart sounds: Normal heart sounds.  Pulmonary:     Effort: Pulmonary effort is normal.     Breath sounds: Wheezing present.  Abdominal:     General: Bowel sounds are normal.     Palpations: Abdomen is soft.     Tenderness: There is no abdominal tenderness.  Musculoskeletal: Normal range of motion.        General: No swelling.  Skin:    General: Skin is warm.  Neurological:     General: No focal deficit present.     Mental Status: She is alert and oriented to person, place, and time.      ED Treatments / Results  Labs (all labs ordered are listed, but only abnormal results are displayed) Labs Reviewed - No data to display  EKG None  Radiology Dg Chest 2 View  Result Date: 06/16/2018 CLINICAL DATA:  Cough and congestion  secondary to bronchitis. Worsening symptoms. EXAM: CHEST - 2 VIEW COMPARISON:  01/29/2014; 04/10/2011 FINDINGS: Grossly unchanged cardiac silhouette and mediastinal contours. There is  mild diffuse thickening of the pulmonary interstitium. No discrete focal airspace opacities. No pleural effusion or pneumothorax. No evidence of edema. No acute osseous abnormalities. IMPRESSION: Findings suggestive of airways disease. No focal airspace opacities to suggest pneumonia. Electronically Signed   By: Sandi Mariscal M.D.   On: 06/16/2018 08:35    Procedures Procedures (including critical care time)  Medications Ordered in ED Medications  ipratropium-albuterol (DUONEB) 0.5-2.5 (3) MG/3ML nebulizer solution 3 mL (3 mLs Nebulization Given 06/16/18 0822)  predniSONE (DELTASONE) tablet 60 mg (60 mg Oral Given 06/16/18 0814)     Initial Impression / Assessment and Plan / ED Course  I have reviewed the triage vital signs and the nursing notes.  Pertinent labs & imaging results that were available during my care of the patient were reviewed by me and considered in my medical decision making (see chart for details).     Patient's history consistent with a bronchitis following an upper respiratory infection probably exacerbated by a long history of smoking.  Chest x-ray will be done today to rule out pneumonia.  Chest ray was negative.  Patient received nebulizer treatment here with improvement in breathing wheezing resolved.  We will have her continue prednisone at home for the next 5 days Mucinex DM and albuterol inhaler will be continued.  Patient received 60 mg of prednisone here today.  Final Clinical Impressions(s) / ED Diagnoses   Final diagnoses:  Bronchitis    ED Discharge Orders         Ordered    predniSONE (DELTASONE) 10 MG tablet  Daily     06/16/18 0913           Fredia Sorrow, MD 06/16/18 [phone]

## 2018-06-16 NOTE — Discharge Instructions (Addendum)
Continue use albuterol inhaler 2 puffs every 6 hours.  Take the prednisone as directed for the next 5 days.  Also recommend Mucinex DM.  Today's chest x-ray negative for pneumonia.

## 2018-06-16 NOTE — ED Triage Notes (Signed)
Pt reports cough/congestion and being dx with bronchitis on Monday.  Was given prednisone, antibiotic, and inhaler.  States she is continuing to feel worse.

## 2018-06-18 ENCOUNTER — Ambulatory Visit: Payer: BLUE CROSS/BLUE SHIELD | Admitting: General Surgery

## 2018-07-11 ENCOUNTER — Encounter: Payer: Self-pay | Admitting: General Surgery

## 2018-07-11 ENCOUNTER — Ambulatory Visit (INDEPENDENT_AMBULATORY_CARE_PROVIDER_SITE_OTHER): Payer: BLUE CROSS/BLUE SHIELD | Admitting: General Surgery

## 2018-07-11 VITALS — BP 138/78 | HR 92 | Temp 97.7°F | Resp 18 | Wt 182.2 lb

## 2018-07-11 DIAGNOSIS — K648 Other hemorrhoids: Secondary | ICD-10-CM | POA: Diagnosis not present

## 2018-07-11 DIAGNOSIS — K644 Residual hemorrhoidal skin tags: Secondary | ICD-10-CM

## 2018-07-11 NOTE — H&P (Signed)
Danielle Robbins; [id number]; 1966-02-16   HPI Patient is a 53 year old white female who was referred to my care by Dr. Laural Golden for evaluation treatment of hemorrhoidal disease.  Patient states she has had intermittent episodes of blood per rectum and hemorrhoidal irritation for many years since the birth of her children.  She has managed this with creams and suppositories.  Recently, the symptoms have worsened and have been uncontrollable with the medications.  She sometimes feels that a hemorrhoid is prolapsing out of her rectum.  She does notice occasional blood on the toilet paper when she wipes herself.  She currently has 3 out of 10 rectal pain.  Her bowel movements are regular.  She did have a colonoscopy 3 years ago. Past Medical History:  Diagnosis Date  . Abdominal bloating 03/12/2015  . Abnormal Pap smear   . Bone spur    neck  . Complication of anesthesia   . Dry skin 10/20/2014  . Elevated cholesterol   . PONV (postoperative nausea and vomiting)   . RLQ abdominal pain 03/12/2015  . Seasonal allergies   . Vaginal Pap smear, abnormal     Past Surgical History:  Procedure Laterality Date  . COLONOSCOPY N/A 08/26/2015   Procedure: COLONOSCOPY;  Surgeon: Rogene Houston, MD;  Location: [facility];  Service: Endoscopy;  Laterality: N/A;  1:00  . ENDOMETRIAL ABLATION     Dr. Elonda Husky  . 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 LEEP    . TUBAL LIGATION      Family History  Problem Relation Age of Onset  . Cancer Maternal Aunt        breast  . Cancer Paternal Grandmother        breast  . Cancer Father        pancreatic  . Cancer Brother 17       bone  . Coronary artery disease Maternal Grandfather     Current Outpatient Medications on File Prior to Visit  Medication Sig Dispense Refill  . atorvastatin (LIPITOR) 20 MG tablet TAKE 1 TABLET (20 MG TOTAL) BY MOUTH AT BEDTIME. 90 tablet 3  . cetirizine (ZYRTEC) 10 MG tablet Take 10 mg by mouth as needed.     .  fluticasone (FLONASE) 50 MCG/ACT nasal spray Place into both nostrils daily.    Marland Kitchen ibuprofen (ADVIL,MOTRIN) 200 MG tablet Take 400 mg by mouth every 6 (six) hours as needed for moderate pain.    . Multiple Vitamins-Minerals (MULTIVITAMIN ADULT PO) Take by mouth.    . predniSONE (DELTASONE) 10 MG tablet Take 4 tablets (40 mg total) by mouth daily. 20 tablet 0  . Probiotic Product ([place] [facility]ALTH PO) Take 1 capsule by mouth daily.      No current facility-administered medications on file prior to visit.     No Known Allergies  Social History   Substance and Sexual Activity  Alcohol Use Yes  . Alcohol/week: 0.0 standard drinks   Comment: occ.    Social History   Tobacco Use  Smoking Status Current Every Day Smoker  . Packs/day: 1.00  . Years: 30.00  . Pack years: 30.00  . Types: Cigarettes  Smokeless Tobacco Never Used  Tobacco Comment   x 60yrs    Review of Systems  Constitutional: Negative.   HENT: Positive for sinus pain.   Eyes: Negative.   Respiratory: Negative.   Cardiovascular: Negative.   Gastrointestinal: Negative.   Genitourinary: Positive for  frequency.  Musculoskeletal: Negative.   Skin: Negative.   Neurological: Negative.   Endo/Heme/Allergies: Negative.   Psychiatric/Behavioral: Negative.     Objective   Vitals:   07/11/18 0920  BP: 138/78  Pulse: 92  Resp: 18  Temp: 97.7 F (36.5 C)    Physical Exam Vitals signs reviewed.  Constitutional:      Appearance: Normal appearance. She is not ill-appearing.  Cardiovascular:     Rate and Rhythm: Normal rate and regular rhythm.     Heart sounds: Normal heart sounds. No murmur. No gallop.   Pulmonary:     Effort: Pulmonary effort is normal. No respiratory distress.     Breath sounds: Normal breath sounds. No stridor. No wheezing, rhonchi or rales.  Genitourinary:    Comments: Rectal examination reveals multiple external hemorrhoids along the left side of the anus along with external  hemorrhoidal skin tag at the 12 o'clock position and an internal hemorrhoid at the 4 o'clock position.  No active bleeding is noted.  Sphincter tone is normal. Skin:    General: Skin is warm and dry.  Neurological:     Mental Status: She is alert and oriented to person, place, and time.     Assessment  Internal and external hemorrhoidal disease, intermittent bleeding Plan   Patient is scheduled for extensive hemorrhoidectomy on 08/02/2018.  The risks and benefits of the procedure including bleeding, infection, and recurrence of her hemorrhoidal disease were fully explained to the patient, who gave informed consent.

## 2018-07-11 NOTE — Progress Notes (Signed)
Danielle Robbins; [id number]; 1966/06/10   HPI Patient is a 53 year old white female who was referred to my care by Dr. Laural Golden for evaluation treatment of hemorrhoidal disease.  Patient states she has had intermittent episodes of blood per rectum and hemorrhoidal irritation for many years since the birth of her children.  She has managed this with creams and suppositories.  Recently, the symptoms have worsened and have been uncontrollable with the medications.  She sometimes feels that a hemorrhoid is prolapsing out of her rectum.  She does notice occasional blood on the toilet paper when she wipes herself.  She currently has 3 out of 10 rectal pain.  Her bowel movements are regular.  She did have a colonoscopy 3 years ago. Past Medical History:  Diagnosis Date  . Abdominal bloating 03/12/2015  . Abnormal Pap smear   . Bone spur    neck  . Complication of anesthesia   . Dry skin 10/20/2014  . Elevated cholesterol   . PONV (postoperative nausea and vomiting)   . RLQ abdominal pain 03/12/2015  . Seasonal allergies   . Vaginal Pap smear, abnormal     Past Surgical History:  Procedure Laterality Date  . COLONOSCOPY N/A 08/26/2015   Procedure: COLONOSCOPY;  Surgeon: Rogene Houston, MD;  Location: [facility];  Service: Endoscopy;  Laterality: N/A;  1:00  . ENDOMETRIAL ABLATION     Dr. Elonda Husky  . 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 LEEP    . TUBAL LIGATION      Family History  Problem Relation Age of Onset  . Cancer Maternal Aunt        breast  . Cancer Paternal Grandmother        breast  . Cancer Father        pancreatic  . Cancer Brother 17       bone  . Coronary artery disease Maternal Grandfather     Current Outpatient Medications on File Prior to Visit  Medication Sig Dispense Refill  . atorvastatin (LIPITOR) 20 MG tablet TAKE 1 TABLET (20 MG TOTAL) BY MOUTH AT BEDTIME. 90 tablet 3  . cetirizine (ZYRTEC) 10 MG tablet Take 10 mg by mouth as needed.     .  fluticasone (FLONASE) 50 MCG/ACT nasal spray Place into both nostrils daily.    Marland Kitchen ibuprofen (ADVIL,MOTRIN) 200 MG tablet Take 400 mg by mouth every 6 (six) hours as needed for moderate pain.    . Multiple Vitamins-Minerals (MULTIVITAMIN ADULT PO) Take by mouth.    . predniSONE (DELTASONE) 10 MG tablet Take 4 tablets (40 mg total) by mouth daily. 20 tablet 0  . Probiotic Product ([employer] COLON HEALTH PO) Take 1 capsule by mouth daily.      No current facility-administered medications on file prior to visit.     No Known Allergies  Social History   Substance and Sexual Activity  Alcohol Use Yes  . Alcohol/week: 0.0 standard drinks   Comment: occ.    Social History   Tobacco Use  Smoking Status Current Every Day Smoker  . Packs/day: 1.00  . Years: 30.00  . Pack years: 30.00  . Types: Cigarettes  Smokeless Tobacco Never Used  Tobacco Comment   x 95yrs    Review of Systems  Constitutional: Negative.   HENT: Positive for sinus pain.   Eyes: Negative.   Respiratory: Negative.   Cardiovascular: Negative.   Gastrointestinal: Negative.   Genitourinary: Positive for  frequency.  Musculoskeletal: Negative.   Skin: Negative.   Neurological: Negative.   Endo/Heme/Allergies: Negative.   Psychiatric/Behavioral: Negative.     Objective   Vitals:   07/11/18 0920  BP: 138/78  Pulse: 92  Resp: 18  Temp: 97.7 F (36.5 C)    Physical Exam Vitals signs reviewed.  Constitutional:      Appearance: Normal appearance. She is not ill-appearing.  Cardiovascular:     Rate and Rhythm: Normal rate and regular rhythm.     Heart sounds: Normal heart sounds. No murmur. No gallop.   Pulmonary:     Effort: Pulmonary effort is normal. No respiratory distress.     Breath sounds: Normal breath sounds. No stridor. No wheezing, rhonchi or rales.  Genitourinary:    Comments: Rectal examination reveals multiple external hemorrhoids along the left side of the anus along with external  hemorrhoidal skin tag at the 12 o'clock position and an internal hemorrhoid at the 4 o'clock position.  No active bleeding is noted.  Sphincter tone is normal. Skin:    General: Skin is warm and dry.  Neurological:     Mental Status: She is alert and oriented to person, place, and time.     Assessment  Internal and external hemorrhoidal disease, intermittent bleeding Plan   Patient is scheduled for extensive hemorrhoidectomy on 08/02/2018.  The risks and benefits of the procedure including bleeding, infection, and recurrence of her hemorrhoidal disease were fully explained to the patient, who gave informed consent.

## 2018-07-25 NOTE — Patient Instructions (Signed)
Danielle Robbins  07/25/2018     @PREFPERIOPPHARMACY @   Your procedure is scheduled on  08/02/2018.  Report to [facility] at  615   A.M.  Call this number if you have problems the morning of surgery:  [phone]   Remember:  Do not eat or drink after midnight.                        Take these medicines the morning of surgery with A SIP OF WATER  Zyrtec.    Do not wear jewelry, make-up or nail polish.  Do not wear lotions, powders, or perfumes, or deodorant.  Do not shave 48 hours prior to surgery.  Men may shave face and neck.  Do not bring valuables to the hospital.  [facility] is not responsible for any belongings or valuables.  Contacts, dentures or bridgework may not be worn into surgery.  Leave your suitcase in the car.  After surgery it may be brought to your room.  For patients admitted to the hospital, discharge time will be determined by your treatment team.  Patients discharged the day of surgery will not be allowed to drive home.   Name and phone number of your driver:   family Special instructions:  None  Please read over the following fact sheets that you were given. Anesthesia Post-op Instructions and Care and Recovery After Surgery      Surgical Procedures for Hemorrhoids Surgical procedures can be used to treat hemorrhoids. Hemorrhoids are swollen veins that are inside the rectum (internal hemorrhoids) or around the anus (external hemorrhoids). They are caused by increased pressure in the anal area. This pressure may result from straining to have a bowel movement (constipation), diarrhea, pregnancy, obesity, anal sex, or sitting for long periods of time. Hemorrhoids can cause symptoms such as pain and bleeding. Surgery may be needed if diet changes, lifestyle changes, and other treatments do not help your symptoms. Various surgical methods may be used. Three common methods are:  Closed hemorrhoidectomy. The hemorrhoids are surgically removed,  and the surgical cuts (incisions) are closed with stitches (sutures).  Open hemorrhoidectomy. The hemorrhoids are surgically removed, but the incisions are allowed to heal without sutures.  Stapled hemorrhoidopexy. The hemorrhoids are removed using a device that takes out a ring of excess tissue. Tell a health care provider about:  Any allergies you have.  All medicines you are taking, including vitamins, herbs, eye drops, creams, and over-the-counter medicines.  Any problems you or family members have had with anesthetic medicines.  Any blood disorders you have.  Any surgeries you have had.  Any medical conditions you have.  Whether you are pregnant or may be pregnant. What are the risks? Generally, this is a safe procedure. However, problems may occur, including:  Infection.  Bleeding.  Allergic reactions to medicines.  Damage to other structures or organs.  Pain.  Constipation.  Difficulty passing urine.  Narrowing of the anal canal (stenosis).  Difficulty controlling bowel movements (incontinence). What happens before the procedure?  Ask your health care provider about: ? Changing or stopping your regular medicines. This is especially important if you are taking diabetes medicines or blood thinners. ? Taking medicines such as aspirin and ibuprofen. These medicines can thin your blood. Do not take these medicines before your procedure if your health care provider instructs you not to.  You may need to have a procedure to examine the  inside of your colon with a scope (colonoscopy). Your health care provider may do this to make sure that there are no other causes for your bleeding or pain.  Follow instructions from your health care provider about eating or drinking restrictions.  You may be instructed to take a laxative and an enema to clean out your colon before surgery (bowel prep). Carefully follow instructions from your health care provider about bowel  prep.  Ask your health care provider how your surgical site will be marked or identified.  You may be given antibiotic medicine to help prevent infection.  Plan to have someone take you home after the procedure. What happens during the procedure?  To reduce your risk of infection: ? Your health care team will wash or sanitize their hands. ? Your skin will be washed with soap.  An IV tube will be inserted into one of your veins.  You will be given one or more of the following: ? A medicine to help you relax (sedative). ? A medicine to numb the area (local anesthetic). ? A medicine to make you fall asleep (general anesthetic). ? A medicine that is injected into an area of your body to numb everything below the injection site (regional anesthetic).  A lubricating jelly may be placed into your rectum.  Your surgeon will insert a short scope (anoscope) into your rectum to examine the hemorrhoids.  One of the following hemorrhoid procedures will be performed. Closed Hemorrhoidectomy  Your surgeon will use surgical instruments to open the tissue around the hemorrhoids.  The veins that supply the hemorrhoids will be tied off with a suture.  The hemorrhoids will be removed.  The tissue that surrounds the hemorrhoids will be closed with sutures that your body can absorb (absorbable sutures). Open Hemorrhoidectomy  The hemorrhoids will be removed with surgical instruments.  The incisions will be left open to heal without sutures. Stapled Hemorrhoidopexy  Your surgeon will use a circular stapling device to remove the hemorrhoids.  The device will be inserted into your anus. It will remove a circular ring of tissue that includes hemorrhoid tissue and some tissue above the hemorrhoids.  The staples in the device will close the edges of removed tissue. This will cut off the blood supply to the hemorrhoids and will pull any remaining hemorrhoids back into place. Each of these  procedures may vary among health care providers and hospitals. What happens after the procedure?  Your blood pressure, heart rate, breathing rate, and blood oxygen level will be monitored often until the medicines you were given have worn off.  You will be given pain medicine as needed. This information is not intended to replace advice given to you by your health care provider. Make sure you discuss any questions you have with your health care provider. Document Released: 04/16/2009 Document Revised: 11/25/2015 Document Reviewed: 09/14/2014 Elsevier Interactive Patient Education  2019 [place] Anesthesia, Adult General anesthesia is the use of medicines to make a person "go to sleep" (unconscious) for a medical procedure. General anesthesia must be used for certain procedures, and is often recommended for procedures that:  Last a long time.  Require you to be [place] or in an unusual position.  Are major and can cause blood loss. The medicines used for general anesthesia are called general anesthetics. As well as making you unconscious for a certain amount of time, these medicines:  Prevent pain.  Control your blood pressure.  Relax your muscles. Tell a health  care provider about:  Any allergies you have.  All medicines you are taking, including vitamins, herbs, eye drops, creams, and over-the-counter medicines.  Any problems you or family members have had with anesthetic medicines.  Types of anesthetics you have had in the past.  Any blood disorders you have.  Any surgeries you have had.  Any medical conditions you have.  Any recent upper respiratory, chest, or ear infections.  Any history of: ? Heart or lung conditions, such as heart failure, sleep apnea, asthma, or chronic obstructive pulmonary disease (COPD). ? Armed forces logistics/support/administrative officer. ? Depression or anxiety.  Any tobacco or drug use, including marijuana or alcohol use.  Whether you are pregnant or may be  pregnant. What are the risks? Generally, this is a safe procedure. However, problems may occur, including:  Allergic reaction.  Lung and heart problems.  Inhaling food or liquid from the stomach into the lungs (aspiration).  Nerve injury.  Dental injury.  Air in the bloodstream, which can lead to stroke.  Extreme agitation or confusion (delirium) when you wake up from the anesthetic.  Waking up during your procedure and being unable to move. This is rare. These problems are more likely to develop if you are having a major surgery or if you have an advanced or serious medical condition. You can prevent some of these complications by answering all of your health care provider's questions thoroughly and by following all instructions before your procedure. General anesthesia can cause side effects, including:  Nausea or vomiting.  A sore throat from the breathing tube.  Hoarseness.  Wheezing or coughing.  Shaking chills.  Tiredness.  Body aches.  Anxiety.  Sleepiness or drowsiness.  Confusion or agitation. What happens before the procedure? Staying hydrated Follow instructions from your health care provider about hydration, which may include:  Up to 2 hours before the procedure - you may continue to drink clear liquids, such as water, clear fruit juice, black coffee, and plain tea.  Eating and drinking restrictions Follow instructions from your health care provider about eating and drinking, which may include:  8 hours before the procedure - stop eating heavy meals or foods such as meat, fried foods, or fatty foods.  6 hours before the procedure - stop eating light meals or foods, such as toast or cereal.  6 hours before the procedure - stop drinking milk or drinks that contain milk.  2 hours before the procedure - stop drinking clear liquids. Medicines Ask your health care provider about:  Changing or stopping your regular medicines. This is especially  important if you are taking diabetes medicines or blood thinners.  Taking medicines such as aspirin and ibuprofen. These medicines can thin your blood. Do not take these medicines unless your health care provider tells you to take them.  Taking over-the-counter medicines, vitamins, herbs, and supplements. Do not take these during the week before your procedure unless your health care provider approves them. General instructions  Starting 3-6 weeks before the procedure, do not use any products that contain nicotine or tobacco, such as cigarettes and e-cigarettes. If you need help quitting, ask your health care provider.  If you brush your teeth on the morning of the procedure, make sure to spit out all of the toothpaste.  Tell your health care provider if you become ill or develop a cold, cough, or fever.  If instructed by your health care provider, bring your sleep apnea device with you on the day of your surgery (if applicable).  Ask your health care provider if you will be going home the same day, the following day, or after a longer hospital stay. ? Plan to have someone take you home from the hospital or clinic. ? Plan to have a responsible adult care for you for at least 24 hours after you leave the hospital or clinic. This is important. What happens during the procedure?   You will be given anesthetics through both of the following: ? A mask placed over your nose and mouth. ? An IV in one of your veins.  You may receive a medicine to help you relax (sedative).  After you are unconscious, a breathing tube may be inserted down your throat to help you breathe. This will be removed before you wake up.  An anesthesia specialist will stay with you throughout your procedure. He or she will: ? Keep you comfortable and safe by continuing to give you medicines and adjusting the amount of medicine that you get. ? Monitor your blood pressure, pulse, and oxygen levels to make sure that the  anesthetics do not cause any problems. The procedure may vary among health care providers and hospitals. What happens after the procedure?  Your blood pressure, temperature, heart rate, breathing rate, and blood oxygen level will be monitored until the medicines you were given have worn off.  You will wake up in a recovery area. You may wake up slowly.  If you feel anxious or agitated, you may be given medicine to help you calm down.  If you will be going home the same day, your health care provider may check to make sure you can walk, drink, and urinate.  Your health care provider will treat any pain or side effects you have before you go home.  Do not drive for 24 hours if you were given a sedative. Summary  General anesthesia is used to keep you [place] and prevent pain during a procedure.  It is important to tell your health care provider about your medical history and any surgeries you have had, and previous experience with anesthesia.  Follow your health care provider's instructions about when to stop eating, drinking, or taking certain medicines before your procedure.  Plan to have someone take you home from the hospital or clinic. This information is not intended to replace advice given to you by your health care provider. Make sure you discuss any questions you have with your health care provider. Document Released: 09/26/2007 Document Revised: 11/06/2017 Document Reviewed: 02/02/2017 Elsevier Interactive Patient Education  2019 [place] Anesthesia, Adult, Care After This sheet gives you information about how to care for yourself after your procedure. Your health care provider may also give you more specific instructions. If you have problems or questions, contact your health care provider. What can I expect after the procedure? After the procedure, the following side effects are common:  Pain or discomfort at the IV site.  Nausea.  Vomiting.  Sore  throat.  Trouble concentrating.  Feeling cold or chills.  Weak or tired.  Sleepiness and fatigue.  Soreness and body aches. These side effects can affect parts of the body that were not involved in surgery. Follow these instructions at home:  For at least 24 hours after the procedure:  Have a responsible adult stay with you. It is important to have someone help care for you until you are awake and alert.  Rest as needed.  Do not: ? Participate in activities in which you could fall or  become injured. ? Drive. ? Use heavy machinery. ? Drink alcohol. ? Take sleeping pills or medicines that cause drowsiness. ? Make important decisions or sign legal documents. ? Take care of children on your own. Eating and drinking  Follow any instructions from your health care provider about eating or drinking restrictions.  When you feel hungry, start by eating small amounts of foods that are soft and easy to digest (bland), such as toast. Gradually return to your regular diet.  Drink enough fluid to keep your urine pale yellow.  If you vomit, rehydrate by drinking water, juice, or clear broth. General instructions  If you have sleep apnea, surgery and certain medicines can increase your risk for breathing problems. Follow instructions from your health care provider about wearing your sleep device: ? Anytime you are sleeping, including during daytime naps. ? While taking prescription pain medicines, sleeping medicines, or medicines that make you drowsy.  Return to your normal activities as told by your health care provider. Ask your health care provider what activities are safe for you.  Take over-the-counter and prescription medicines only as told by your health care provider.  If you smoke, do not smoke without supervision.  Keep all follow-up visits as told by your health care provider. This is important. Contact a health care provider if:  You have nausea or vomiting that does not  get better with medicine.  You cannot eat or drink without vomiting.  You have pain that does not get better with medicine.  You are unable to pass urine.  You develop a skin rash.  You have a fever.  You have redness around your IV site that gets worse. Get help right away if:  You have difficulty breathing.  You have chest pain.  You have blood in your urine or stool, or you vomit blood. Summary  After the procedure, it is common to have a sore throat or nausea. It is also common to feel tired.  Have a responsible adult stay with you for the first 24 hours after general anesthesia. It is important to have someone help care for you until you are awake and alert.  When you feel hungry, start by eating small amounts of foods that are soft and easy to digest (bland), such as toast. Gradually return to your regular diet.  Drink enough fluid to keep your urine pale yellow.  Return to your normal activities as told by your health care provider. Ask your health care provider what activities are safe for you. This information is not intended to replace advice given to you by your health care provider. Make sure you discuss any questions you have with your health care provider. Document Released: 09/25/2000 Document Revised: 02/02/2017 Document Reviewed: 02/02/2017 Elsevier Interactive Patient Education  2019 [employer].

## 2018-07-29 ENCOUNTER — Ambulatory Visit (HOSPITAL_COMMUNITY)
Admission: RE | Admit: 2018-07-29 | Discharge: 2018-07-29 | Disposition: A | Discharge status: Home / Self Care | Payer: BLUE CROSS/BLUE SHIELD | Source: Ambulatory Visit | Attending: General Surgery | Admitting: General Surgery

## 2018-07-29 ENCOUNTER — Other Ambulatory Visit: Payer: Self-pay

## 2018-07-29 ENCOUNTER — Encounter (HOSPITAL_COMMUNITY): Payer: Self-pay

## 2018-07-29 DIAGNOSIS — Z01818 Encounter for other preprocedural examination: Secondary | ICD-10-CM | POA: Diagnosis not present

## 2018-07-29 HISTORY — DX: Prediabetes: R73.03

## 2018-07-29 LAB — BASIC METABOLIC PANEL
Anion gap: 9 (ref 5–15)
BUN: 7 mg/dL (ref 6–20)
CHLORIDE: 107 mmol/L (ref 98–111)
CO2: 25 mmol/L (ref 22–32)
Calcium: 9.4 mg/dL (ref 8.9–10.3)
Creatinine, Ser: 0.7 mg/dL (ref 0.44–1.00)
GFR calc Af Amer: >60 mL/min (ref >60)
GFR calc non Af Amer: >60 mL/min (ref >60)
Glucose, Bld: 93 mg/dL (ref 70–99)
Potassium: 3.5 mmol/L (ref 3.5–5.1)
Sodium: 141 mmol/L (ref 135–145)

## 2018-07-29 LAB — CBC
HCT: 47.3 % — ABNORMAL HIGH (ref 36.0–46.0)
HEMOGLOBIN: 15.2 g/dL — AB (ref 12.0–15.0)
MCH: 29.7 pg (ref 26.0–34.0)
MCHC: 32.1 g/dL (ref 30.0–36.0)
MCV: 92.4 fL (ref 80.0–100.0)
Platelets: 226 10*3/uL (ref 150–400)
RBC: 5.12 MIL/uL — ABNORMAL HIGH (ref 3.87–5.11)
RDW: 12.1 % (ref 11.5–15.5)
WBC: 9.4 10*3/uL (ref 4.0–10.5)
nRBC: 0 % (ref 0.0–0.2)

## 2018-07-29 LAB — HCG, SERUM, QUALITATIVE: Preg, Serum: NEGATIVE

## 2018-08-02 ENCOUNTER — Ambulatory Visit (HOSPITAL_COMMUNITY): Payer: BLUE CROSS/BLUE SHIELD | Admitting: Anesthesiology

## 2018-08-02 ENCOUNTER — Encounter (HOSPITAL_COMMUNITY): Payer: Self-pay | Admitting: *Deleted

## 2018-08-02 ENCOUNTER — Encounter (HOSPITAL_COMMUNITY)
Admission: RE | Disposition: A | Discharge status: Home / Self Care | Payer: Self-pay | Source: Home / Self Care | Attending: General Surgery

## 2018-08-02 ENCOUNTER — Ambulatory Visit (HOSPITAL_COMMUNITY)
Admission: RE | Admit: 2018-08-02 | Discharge: 2018-08-02 | Disposition: A | Discharge status: Home / Self Care | Payer: BLUE CROSS/BLUE SHIELD | Attending: General Surgery | Admitting: General Surgery

## 2018-08-02 DIAGNOSIS — R7303 Prediabetes: Secondary | ICD-10-CM | POA: Insufficient documentation

## 2018-08-02 DIAGNOSIS — Z79899 Other long term (current) drug therapy: Secondary | ICD-10-CM | POA: Insufficient documentation

## 2018-08-02 DIAGNOSIS — Z7952 Long term (current) use of systemic steroids: Secondary | ICD-10-CM | POA: Insufficient documentation

## 2018-08-02 DIAGNOSIS — Z6830 Body mass index (BMI) 30.0-30.9, adult: Secondary | ICD-10-CM | POA: Diagnosis not present

## 2018-08-02 DIAGNOSIS — K648 Other hemorrhoids: Secondary | ICD-10-CM | POA: Diagnosis not present

## 2018-08-02 DIAGNOSIS — E7800 Pure hypercholesterolemia, unspecified: Secondary | ICD-10-CM | POA: Insufficient documentation

## 2018-08-02 DIAGNOSIS — E669 Obesity, unspecified: Secondary | ICD-10-CM | POA: Diagnosis not present

## 2018-08-02 DIAGNOSIS — K644 Residual hemorrhoidal skin tags: Secondary | ICD-10-CM | POA: Diagnosis not present

## 2018-08-02 DIAGNOSIS — F17210 Nicotine dependence, cigarettes, uncomplicated: Secondary | ICD-10-CM | POA: Insufficient documentation

## 2018-08-02 HISTORY — PX: HEMORRHOID SURGERY: SHX153

## 2018-08-02 SURGERY — HEMORRHOIDECTOMY
Anesthesia: General

## 2018-08-02 MED ORDER — ONDANSETRON HCL 4 MG/2ML IJ SOLN
INTRAMUSCULAR | Status: AC
  Filled 2018-08-02: qty 2

## 2018-08-02 MED ORDER — SODIUM CHLORIDE 0.9 % IV SOLN
2.0000 g | INTRAVENOUS | Status: AC
  Administered 2018-08-02: 2 g via INTRAVENOUS
  Filled 2018-08-02: qty 2

## 2018-08-02 MED ORDER — FENTANYL CITRATE (PF) 250 MCG/5ML IJ SOLN
INTRAMUSCULAR | Status: AC
  Filled 2018-08-02: qty 5

## 2018-08-02 MED ORDER — DEXAMETHASONE SODIUM PHOSPHATE 10 MG/ML IJ SOLN
INTRAMUSCULAR | Status: AC
  Filled 2018-08-02: qty 1

## 2018-08-02 MED ORDER — HYDROCODONE-ACETAMINOPHEN 7.5-325 MG PO TABS: 1.0000 | ORAL_TABLET | Freq: Once | ORAL | Status: DC | PRN

## 2018-08-02 MED ORDER — MEPERIDINE HCL 50 MG/ML IJ SOLN: 6.2500 mg | INTRAMUSCULAR | Status: DC | PRN

## 2018-08-02 MED ORDER — HYDROMORPHONE HCL 1 MG/ML IJ SOLN
INTRAMUSCULAR | Status: AC
  Filled 2018-08-02: qty 0.5

## 2018-08-02 MED ORDER — CHLORHEXIDINE GLUCONATE CLOTH 2 % EX PADS: 6.0000 | MEDICATED_PAD | Freq: Once | CUTANEOUS | Status: DC

## 2018-08-02 MED ORDER — LIDOCAINE VISCOUS HCL 2 % MT SOLN
OROMUCOSAL | Status: DC | PRN
  Administered 2018-08-02: 1

## 2018-08-02 MED ORDER — KETOROLAC TROMETHAMINE 30 MG/ML IJ SOLN
30.0000 mg | Freq: Once | INTRAMUSCULAR | Status: AC
  Administered 2018-08-02: 30 mg via INTRAVENOUS
  Filled 2018-08-02: qty 1

## 2018-08-02 MED ORDER — MIDAZOLAM HCL 5 MG/5ML IJ SOLN
INTRAMUSCULAR | Status: DC | PRN
  Administered 2018-08-02: 2 mg via INTRAVENOUS

## 2018-08-02 MED ORDER — GLYCOPYRROLATE PF 0.2 MG/ML IJ SOSY
PREFILLED_SYRINGE | INTRAMUSCULAR | Status: DC | PRN
  Administered 2018-08-02: .2 mg via INTRAVENOUS

## 2018-08-02 MED ORDER — DEXAMETHASONE SODIUM PHOSPHATE 4 MG/ML IJ SOLN
INTRAMUSCULAR | Status: DC | PRN
  Administered 2018-08-02: 10 mg via INTRAVENOUS

## 2018-08-02 MED ORDER — BUPIVACAINE LIPOSOME 1.3 % IJ SUSP
INTRAMUSCULAR | Status: DC | PRN
  Administered 2018-08-02: 20 mL

## 2018-08-02 MED ORDER — ONDANSETRON HCL 4 MG PO TABS: 4.0000 mg | ORAL_TABLET | Freq: Three times a day (TID) | ORAL | 0 refills | Status: DC | PRN

## 2018-08-02 MED ORDER — OXYCODONE-ACETAMINOPHEN 7.5-325 MG PO TABS: 1.0000 | ORAL_TABLET | Freq: Four times a day (QID) | ORAL | 0 refills | Status: DC | PRN

## 2018-08-02 MED ORDER — PROPOFOL 10 MG/ML IV BOLUS
INTRAVENOUS | Status: AC
  Filled 2018-08-02: qty 40

## 2018-08-02 MED ORDER — LIDOCAINE 2% (20 MG/ML) 5 ML SYRINGE
INTRAMUSCULAR | Status: DC | PRN
  Administered 2018-08-02: 40 mg via INTRAVENOUS

## 2018-08-02 MED ORDER — LACTATED RINGERS IV SOLN
INTRAVENOUS | Status: DC
  Administered 2018-08-02: 07:00:00 via INTRAVENOUS

## 2018-08-02 MED ORDER — LACTATED RINGERS IV SOLN: INTRAVENOUS | Status: DC

## 2018-08-02 MED ORDER — MIDAZOLAM HCL 2 MG/2ML IJ SOLN
INTRAMUSCULAR | Status: AC
  Filled 2018-08-02: qty 2

## 2018-08-02 MED ORDER — PROMETHAZINE HCL 25 MG/ML IJ SOLN: 6.2500 mg | INTRAMUSCULAR | Status: DC | PRN

## 2018-08-02 MED ORDER — LIDOCAINE VISCOUS HCL 2 % MT SOLN
OROMUCOSAL | Status: AC
  Filled 2018-08-02: qty 15

## 2018-08-02 MED ORDER — SODIUM CHLORIDE 0.9 % IR SOLN
Status: DC | PRN
  Administered 2018-08-02: 1000 mL

## 2018-08-02 MED ORDER — FENTANYL CITRATE (PF) 100 MCG/2ML IJ SOLN
INTRAMUSCULAR | Status: DC | PRN
  Administered 2018-08-02 (×2): 25 ug via INTRAVENOUS
  Administered 2018-08-02: 50 ug via INTRAVENOUS

## 2018-08-02 MED ORDER — HYDROMORPHONE HCL 1 MG/ML IJ SOLN
0.2500 mg | INTRAMUSCULAR | Status: DC | PRN
  Administered 2018-08-02 (×2): 0.5 mg via INTRAVENOUS

## 2018-08-02 MED ORDER — PROPOFOL 10 MG/ML IV BOLUS
INTRAVENOUS | Status: DC | PRN
  Administered 2018-08-02: 150 mg via INTRAVENOUS

## 2018-08-02 MED ORDER — ONDANSETRON HCL 4 MG/2ML IJ SOLN
INTRAMUSCULAR | Status: DC | PRN
  Administered 2018-08-02: 4 mg via INTRAVENOUS

## 2018-08-02 MED ORDER — BUPIVACAINE LIPOSOME 1.3 % IJ SUSP
INTRAMUSCULAR | Status: AC
  Filled 2018-08-02: qty 20

## 2018-08-02 SURGICAL SUPPLY — 27 items
CLOTH BEACON ORANGE TIMEOUT ST (SAFETY) ×2 IMPLANT
COVER LIGHT HANDLE STERIS (MISCELLANEOUS) ×4 IMPLANT
DRAPE HALF SHEET 40X57 (DRAPES) ×2 IMPLANT
DRAPE PROXIMA HALF (DRAPES) ×2 IMPLANT
ELECT REM PT RETURN 9FT ADLT (ELECTROSURGICAL) ×2
ELECTRODE REM PT RTRN 9FT ADLT (ELECTROSURGICAL) ×1 IMPLANT
GAUZE SPONGE 4X4 12PLY STRL (GAUZE/BANDAGES/DRESSINGS) ×3 IMPLANT
GLOVE BIO SURGEON STRL SZ7 (GLOVE) ×2 IMPLANT
GLOVE BIOGEL PI IND STRL 7.0 (GLOVE) ×2 IMPLANT
GLOVE BIOGEL PI INDICATOR 7.0 (GLOVE) ×3
GLOVE SURG SS PI 7.5 STRL IVOR (GLOVE) ×2 IMPLANT
GOWN STRL REUS W/TWL LRG LVL3 (GOWN DISPOSABLE) ×4 IMPLANT
HEMOSTAT SURGICEL 4X8 (HEMOSTASIS) ×2 IMPLANT
KIT TURNOVER CYSTO (KITS) ×2 IMPLANT
LIGASURE IMPACT 36 18CM CVD LR (INSTRUMENTS) ×2 IMPLANT
MANIFOLD NEPTUNE II (INSTRUMENTS) ×2 IMPLANT
NDL HYPO 18GX1.5 BLUNT FILL (NEEDLE) ×1 IMPLANT
NEEDLE HYPO 18GX1.5 BLUNT FILL (NEEDLE) ×2 IMPLANT
NEEDLE HYPO 22GX1.5 SAFETY (NEEDLE) ×2 IMPLANT
NS IRRIG 1000ML POUR BTL (IV SOLUTION) ×2 IMPLANT
PACK PERI GYN (CUSTOM PROCEDURE TRAY) ×2 IMPLANT
PAD ARMBOARD 7.5X6 YLW CONV (MISCELLANEOUS) ×2 IMPLANT
SET BASIN LINEN APH (SET/KITS/TRAYS/PACK) ×2 IMPLANT
SURGILUBE 3G PEEL PACK STRL (MISCELLANEOUS) ×3 IMPLANT
SUT SILK 0 FSL (SUTURE) ×2 IMPLANT
SUT VIC AB 2-0 CT2 27 (SUTURE) ×2 IMPLANT
SYR 20CC LL (SYRINGE) ×4 IMPLANT

## 2018-08-02 NOTE — Anesthesia Preprocedure Evaluation (Signed)
Anesthesia Evaluation    Airway Mallampati: II       Dental  (+) Dental Advidsory Given   Pulmonary Current Smoker,    breath sounds clear to auscultation       Cardiovascular  Rhythm:regular     Neuro/Psych    GI/Hepatic   Endo/Other    Renal/GU      Musculoskeletal   Abdominal   Peds  Hematology   Anesthesia Other Findings 12 lead NSR 83 Obese- pre-diabetic  Gaining weight Tobacco abuse ongoing  Reproductive/Obstetrics                             Anesthesia Physical Anesthesia Plan  ASA: II  Anesthesia Plan: General   Post-op Pain Management:    Induction:   PONV Risk Score and Plan:   Airway Management Planned:   Additional Equipment:   Intra-op Plan:   Post-operative Plan:   Informed Consent:   Plan Discussed with: Anesthesiologist  Anesthesia Plan Comments:         Anesthesia Quick Evaluation

## 2018-08-02 NOTE — Discharge Instructions (Signed)
How to Take a Sitz Bath  A sitz bath is a warm water bath that may be used to care for your rectum, genital area, or the area between your rectum and genitals (perineum). For a sitz bath, the water only comes up to your hips and covers your buttocks. A sitz bath may done at home in a bathtub or with a portable sitz bath that fits over the toilet.  Your health care provider may recommend a sitz bath to help:   Relieve pain and discomfort after delivering a baby.   Relieve pain and itching from hemorrhoids or anal fissures.   Relieve pain after certain surgeries.   Relax muscles that are sore or tight.  How to take a sitz bath  Take 3-4 sitz baths a day, or as many as told by your health care provider.  Bathtub sitz bath  To take a sitz bath in a bathtub:  1. Partially fill a bathtub with warm water. The water should be deep enough to cover your hips and buttocks when you are sitting in the tub.  2. If your health care provider told you to put medicine in the water, follow his or her instructions.  3. Sit in the water.  4. Open the tub drain a little, and leave it open during your bath.  5. Turn on the warm water again, enough to replace the water that is draining out. Keep the water running throughout your bath. This helps keep the water at the right level and the right temperature.  6. Soak in the water for 15-20 minutes, or as long as told by your health care provider.  7. When you are done, be careful when you stand up. You may feel dizzy.  8. After the sitz bath, pat yourself dry. Do not rub your skin to dry it.    Over-the-toilet sitz bath  To take a sitz bath with an over-the-toilet basin:  1. Follow the manufacturer's instructions.  2. Fill the basin with warm water.  3. If your health care provider told you to put medicine in the water, follow his or her instructions.  4. Sit on the seat. Make sure the water covers your buttocks and perineum.  5. Soak in the water for 15-20 minutes, or as long as told by  your health care provider.  6. After the sitz bath, pat yourself dry. Do not rub your skin to dry it.  7. Clean and dry the basin between uses.  8. Discard the basin if it cracks, or according to the manufacturer's instructions.  Contact a health care provider if:   Your symptoms get worse. Do not continue with sitz baths if your symptoms get worse.   You have new symptoms. If this happens, do not continue with sitz baths until you talk with your health care provider.  Summary   A sitz bath is a warm water bath in which the water only comes up to your hips and covers your buttocks.   A sitz bath may help relieve itching, relieve pain, and relax muscles that are sore or tight in the lower part of your body, including your genital area.   Take 3-4 sitz baths a day, or as many as told by your health care provider. Soak in the water for 15-20 minutes.   Do not continue with sitz baths if your symptoms get worse.  This information is not intended to replace advice given to you by your health care provider. Make 

## 2018-08-02 NOTE — Anesthesia Procedure Notes (Signed)
Procedure Name: LMA Insertion Date/Time: 08/02/2018 7:29 AM Performed by: Andree Elk, Amayah Staheli A, CRNA Pre-anesthesia Checklist: Emergency Drugs available, Patient identified, Suction available, Patient being monitored and Timeout performed Patient Re-evaluated:Patient Re-evaluated prior to induction Oxygen Delivery Method: Circle system utilized Preoxygenation: Pre-oxygenation with 100% oxygen Induction Type: IV induction Ventilation: Mask ventilation without difficulty LMA: LMA inserted LMA Size: 4.0 Number of attempts: 1 Placement Confirmation: positive ETCO2 and breath sounds checked- equal and bilateral Tube secured with: Tape Dental Injury: Teeth and Oropharynx as per pre-operative assessment

## 2018-08-02 NOTE — Op Note (Signed)
Patient:  Danielle Robbins  DOB:  19-Feb-1966  MRN:  [id number]   Preop Diagnosis: Internal and external prolapsing hemorrhoids  Postop Diagnosis: Same  Procedure: Extensive hemorrhoidectomy  Surgeon: Aviva Signs, MD  Anes: General  Indications: Patient is a 53 year old white female who has had a longstanding history of prolapsing hemorrhoidal disease.  The risks and benefits of the procedure including bleeding, infection, pain, and the strong possibility of recurrence of the hemorrhoidal disease were fully explained to the patient, who gave informed consent.  Procedure note: The patient was placed in the lithotomy position after general anesthesia was administered.  The perineum was prepped and draped using the usual sterile technique with Betadine.  Surgical site confirmation was performed.  On anoscopy, the patient had prominent internal hemorrhoids at the 4:00, 7:00, and 11:00 positions.  In addition, she had significant external hemorrhoidal disease at the 4:00 and 7:00 positions.  She had significant external mucosal prolapse, and this limited the extent of the surgery.  The internal and external hemorrhoidal tissue at the 4 o'clock position was excised in a column-like fashion using the LigaSure.  The internal hemorrhoids at the 7 and 11:00 positions were removed in column-like fashion using the LigaSure.  An external hemorrhoidal skin tag at the 6 o'clock position was excised without difficulty.  Care was taken to avoid the external sphincter mechanism.  It was intact at the end of the procedure.  No abnormal bleeding was noted at the end of the procedure.  Exparel was instilled into the surrounding perineum.  Surgicel and Viscous Xylocaine rectal packing was then placed.  All tape and needle counts were correct at the end of the procedure.  The patient was awakened and transferred to PACU in stable condition.  Complications: None  EBL: Minimal  Specimen: Hemorrhoids

## 2018-08-02 NOTE — Interval H&P Note (Signed)
History and Physical Interval Note:  08/02/2018 7:15 AM  Danielle Robbins  has presented today for surgery, with the diagnosis of internal and external hemorrhoids  The various methods of treatment have been discussed with the patient and family. After consideration of risks, benefits and other options for treatment, the patient has consented to  Procedure(s): HEMORRHOIDECTOMY (N/A) as a surgical intervention .  The patient's history has been reviewed, patient examined, no change in status, stable for surgery.  I have reviewed the patient's chart and labs.  Questions were answered to the patient's satisfaction.     Aviva Signs

## 2018-08-02 NOTE — Addendum Note (Signed)
Addendum  created 08/02/18 9791 by Mickel Baas, CRNA   Intraprocedure Meds edited

## 2018-08-02 NOTE — Anesthesia Postprocedure Evaluation (Signed)
Anesthesia Post Note  Patient: Danielle Robbins  Procedure(s) Performed: HEMORRHOIDECTOMY (N/A )  Patient location during evaluation: PACU Anesthesia Type: General Level of consciousness: awake and alert and oriented Pain management: pain level controlled (Receiving Dilaudid for pain control) Vital Signs Assessment: post-procedure vital signs reviewed and stable Respiratory status: spontaneous breathing Cardiovascular status: stable Postop Assessment: no apparent nausea or vomiting Anesthetic complications: no     Last Vitals:  Vitals:   08/02/18 0845 08/02/18 0900  BP: 116/82 116/77  Pulse: 80 76  Resp: 17 16  Temp:    SpO2: 94% 92%    Last Pain:  Vitals:   08/02/18 0900  TempSrc:   PainSc: 5                  ADAMS, AMY A

## 2018-08-02 NOTE — Transfer of Care (Signed)
Immediate Anesthesia Transfer of Care Note  Patient: Danielle Robbins  Procedure(s) Performed: HEMORRHOIDECTOMY (N/A )  Patient Location: PACU  Anesthesia Type:General  Level of Consciousness: oriented, drowsy and patient cooperative  Airway & Oxygen Therapy: Patient Spontanous Breathing and Patient connected to face mask oxygen  Post-op Assessment: Report given to RN and Post -op Vital signs reviewed and stable  Post vital signs: Reviewed and stable  Last Vitals:  Vitals Value Taken Time  BP 122/72 08/02/2018  8:15 AM  Temp 36.6 C 08/02/2018  8:11 AM  Pulse 73 08/02/2018  8:18 AM  Resp 15 08/02/2018  8:18 AM  SpO2 100 % 08/02/2018  8:18 AM  Vitals shown include unvalidated device data.  Last Pain:  Vitals:   08/02/18 0810  TempSrc:   PainSc: (P) 0-No pain      Patients Stated Pain Goal: 7 (52/08/02 2336)  Complications: No apparent anesthesia complications

## 2018-08-05 ENCOUNTER — Encounter (HOSPITAL_COMMUNITY): Payer: Self-pay | Admitting: General Surgery

## 2018-08-08 ENCOUNTER — Ambulatory Visit (INDEPENDENT_AMBULATORY_CARE_PROVIDER_SITE_OTHER): Payer: Self-pay | Admitting: General Surgery

## 2018-08-08 ENCOUNTER — Encounter: Payer: Self-pay | Admitting: General Surgery

## 2018-08-08 VITALS — BP 114/71 | HR 95 | Temp 97.7°F | Resp 18 | Wt 177.8 lb

## 2018-08-08 DIAGNOSIS — Z09 Encounter for follow-up examination after completed treatment for conditions other than malignant neoplasm: Secondary | ICD-10-CM

## 2018-08-08 MED ORDER — OXYCODONE-ACETAMINOPHEN 7.5-325 MG PO TABS: 1.0000 | ORAL_TABLET | Freq: Four times a day (QID) | ORAL | 0 refills | Status: AC | PRN

## 2018-08-08 NOTE — Progress Notes (Signed)
Subjective:     Danielle Robbins  Here for postoperative visit.  Patient having moderate rectal pain with bowel movements.  Is having some blood per rectum.  She is on Percocet for pain which is somewhat helpful.  She has started doing sitz baths. Objective:    BP 114/71 (BP Location: Left Arm, Patient Position: Sitting, Cuff Size: Normal)   Pulse 95   Temp 97.7 F (36.5 C) (Temporal)   Resp 18   Wt 177 lb 12.8 oz (80.6 kg)   BMI 29.59 kg/m   General:  alert, cooperative and no distress  Rectal examination reveals healing tissue.  No active bleeding noted. Final pathology consistent with diagnosis.     Assessment:    Patient is recovering as anticipated.    Plan:   I reassured the patient that her pain will ease with time.  I have reordered her Percocet.  I will see her in 2 weeks for follow-up.  Continue sitz baths.

## 2018-08-20 ENCOUNTER — Ambulatory Visit (INDEPENDENT_AMBULATORY_CARE_PROVIDER_SITE_OTHER): Payer: Self-pay | Admitting: General Surgery

## 2018-08-20 ENCOUNTER — Encounter: Payer: Self-pay | Admitting: General Surgery

## 2018-08-20 VITALS — BP 122/78 | HR 93 | Temp 97.5°F | Resp 18 | Wt 176.0 lb

## 2018-08-20 DIAGNOSIS — Z09 Encounter for follow-up examination after completed treatment for conditions other than malignant neoplasm: Secondary | ICD-10-CM

## 2018-08-20 NOTE — Progress Notes (Signed)
Subjective:     Danielle Robbins  Here for follow-up visit.  States she is doing much better.  Has minimal discomfort when having a bowel movement.  No significant bleeding noted. Objective:    BP 122/78 (BP Location: Left Arm, Patient Position: Sitting, Cuff Size: Normal)   Pulse 93   Temp (!) 97.5 F (36.4 C) (Temporal)   Resp 18   Wt 176 lb (79.8 kg)   BMI 29.29 kg/m   General:  alert, cooperative and no distress       Assessment:    Doing well postoperatively.    Plan:   Continue to avoid constipation or straining when having bowel movements.  May return to work without restriction on 08/21/2018.  Follow-up here as needed.

## 2019-04-18 ENCOUNTER — Other Ambulatory Visit: Payer: Self-pay | Admitting: Adult Health

## 2019-05-12 DIAGNOSIS — J309 Allergic rhinitis, unspecified: Secondary | ICD-10-CM | POA: Diagnosis not present

## 2019-05-12 DIAGNOSIS — J0100 Acute maxillary sinusitis, unspecified: Secondary | ICD-10-CM | POA: Diagnosis not present

## 2019-05-12 DIAGNOSIS — Z1159 Encounter for screening for other viral diseases: Secondary | ICD-10-CM | POA: Diagnosis not present

## 2019-07-07 ENCOUNTER — Other Ambulatory Visit: Payer: Self-pay

## 2019-07-07 ENCOUNTER — Ambulatory Visit: Payer: BC Managed Care – PPO | Attending: Internal Medicine

## 2019-07-07 DIAGNOSIS — Z20822 Contact with and (suspected) exposure to covid-19: Secondary | ICD-10-CM

## 2019-07-08 LAB — NOVEL CORONAVIRUS, NAA: SARS-CoV-2, NAA: NOT DETECTED

## 2019-09-25 ENCOUNTER — Ambulatory Visit: Payer: BC Managed Care – PPO | Attending: Internal Medicine

## 2019-09-25 ENCOUNTER — Other Ambulatory Visit: Payer: Self-pay

## 2019-09-25 DIAGNOSIS — Z20822 Contact with and (suspected) exposure to covid-19: Secondary | ICD-10-CM

## 2019-09-26 LAB — NOVEL CORONAVIRUS, NAA: SARS-CoV-2, NAA: NOT DETECTED

## 2019-09-26 LAB — SARS-COV-2, NAA 2 DAY TAT

## 2020-03-02 DIAGNOSIS — B370 Candidal stomatitis: Secondary | ICD-10-CM | POA: Diagnosis not present

## 2020-05-10 DIAGNOSIS — L918 Other hypertrophic disorders of the skin: Secondary | ICD-10-CM | POA: Diagnosis not present

## 2020-05-10 DIAGNOSIS — B078 Other viral warts: Secondary | ICD-10-CM | POA: Diagnosis not present

## 2020-05-13 ENCOUNTER — Other Ambulatory Visit: Payer: Self-pay | Admitting: Adult Health

## 2020-06-10 ENCOUNTER — Other Ambulatory Visit: Payer: Self-pay | Admitting: Women's Health

## 2020-08-02 ENCOUNTER — Other Ambulatory Visit: Payer: Self-pay | Admitting: Women's Health

## 2020-08-10 ENCOUNTER — Other Ambulatory Visit: Payer: Self-pay | Admitting: Women's Health

## 2020-08-18 ENCOUNTER — Telehealth: Payer: Self-pay | Admitting: Women's Health

## 2020-08-18 ENCOUNTER — Other Ambulatory Visit (HOSPITAL_COMMUNITY): Payer: Self-pay | Admitting: Adult Health

## 2020-08-18 DIAGNOSIS — Z1231 Encounter for screening mammogram for malignant neoplasm of breast: Secondary | ICD-10-CM

## 2020-08-18 MED ORDER — ATORVASTATIN CALCIUM 20 MG PO TABS: ORAL_TABLET | ORAL | 1 refills | Status: DC

## 2020-08-18 NOTE — Telephone Encounter (Signed)
I spoke with Danielle Robbins, will refill lipitor and she will make appt for physical and labs

## 2020-08-18 NOTE — Telephone Encounter (Signed)
Patient states that the prescription atorvastatin (LIPITOR) 20 MG hasn't been sent over to her pharmacy. Clinical staff will follow up with patient.

## 2020-08-24 ENCOUNTER — Other Ambulatory Visit: Payer: Self-pay

## 2020-08-24 MED ORDER — ATORVASTATIN CALCIUM 20 MG PO TABS: ORAL_TABLET | ORAL | 3 refills | Status: DC

## 2020-08-25 NOTE — Telephone Encounter (Signed)
Called pt to inform her of prescription approval. No answer, left vm.

## 2020-08-26 ENCOUNTER — Other Ambulatory Visit: Payer: Self-pay

## 2020-08-26 ENCOUNTER — Ambulatory Visit (HOSPITAL_COMMUNITY)
Admission: RE | Admit: 2020-08-26 | Discharge: 2020-08-26 | Disposition: A | Discharge status: Home / Self Care | Payer: BC Managed Care – PPO | Source: Ambulatory Visit | Attending: Adult Health | Admitting: Adult Health

## 2020-08-26 DIAGNOSIS — Z1231 Encounter for screening mammogram for malignant neoplasm of breast: Secondary | ICD-10-CM

## 2020-08-31 ENCOUNTER — Encounter (INDEPENDENT_AMBULATORY_CARE_PROVIDER_SITE_OTHER): Payer: Self-pay | Admitting: *Deleted

## 2020-09-11 ENCOUNTER — Other Ambulatory Visit: Payer: Self-pay | Admitting: Adult Health

## 2020-09-17 ENCOUNTER — Ambulatory Visit (INDEPENDENT_AMBULATORY_CARE_PROVIDER_SITE_OTHER): Payer: BC Managed Care – PPO | Admitting: Adult Health

## 2020-09-17 ENCOUNTER — Other Ambulatory Visit (HOSPITAL_COMMUNITY)
Admission: RE | Admit: 2020-09-17 | Discharge: 2020-09-17 | Disposition: A | Discharge status: Home / Self Care | Payer: BC Managed Care – PPO | Source: Ambulatory Visit | Attending: Adult Health | Admitting: Adult Health

## 2020-09-17 ENCOUNTER — Other Ambulatory Visit: Payer: Self-pay

## 2020-09-17 ENCOUNTER — Encounter: Payer: Self-pay | Admitting: Adult Health

## 2020-09-17 VITALS — BP 122/75 | HR 87 | Ht 65.0 in | Wt 178.5 lb

## 2020-09-17 DIAGNOSIS — Z01419 Encounter for gynecological examination (general) (routine) without abnormal findings: Secondary | ICD-10-CM

## 2020-09-17 DIAGNOSIS — Z1211 Encounter for screening for malignant neoplasm of colon: Secondary | ICD-10-CM | POA: Diagnosis not present

## 2020-09-17 DIAGNOSIS — E7800 Pure hypercholesterolemia, unspecified: Secondary | ICD-10-CM

## 2020-09-17 LAB — HEMOCCULT GUIAC POC 1CARD (OFFICE): Fecal Occult Blood, POC: NEGATIVE

## 2020-09-17 NOTE — Progress Notes (Signed)
Patient ID: Danielle Robbins, female   DOB: August 30, 1965, 55 y.o.   MRN: [id number] History of Present Illness: Danielle Robbins is a 55 year old white female,married, PM in for a well woman gyn exam and pap. PCP is Dr Maudie Mercury.   Current Medications, Allergies, Past Medical History, Past Surgical History, Family History and Social History were reviewed in [employer] record.     Review of Systems:  Patient denies any daily headaches(has sinus headache today), hearing loss, fatigue, blurred vision, shortness of breath, chest pain, abdominal pain, problems with bowel movements, urination, or intercourse(not active). No joint pain or mood swings.   Physical Exam:BP 122/75 (BP Location: Left Arm, Patient Position: Sitting, Cuff Size: Normal)   Pulse 87   Ht 5\' 5"  (1.651 m)   Wt 178 lb 8 oz (81 kg)   BMI 29.70 kg/m  General:  Well developed, well nourished, no acute distress Skin:  Warm and dry Neck:  Midline trachea, normal thyroid, good ROM, no lymphadenopathy Lungs; Clear to auscultation bilaterally Breast:  No dominant palpable mass, retraction, or nipple discharge Cardiovascular: Regular rate and rhythm Abdomen:  Soft, non tender, no hepatosplenomegaly Pelvic:  External genitalia is normal in appearance, no lesions.  The vagina is normal in appearance. Urethra has no lesions or masses. The cervix is sp LEEP, pap with HR HPV genotyping performed .  Uterus is felt to be normal size, shape, and contour.  No adnexal masses or tenderness noted.Bladder is non tender, no masses felt. Rectal: Good sphincter tone, no polyps, or hemorrhoids felt.  Hemoccult negative. Extremities/musculoskeletal:  No swelling or varicosities noted, no clubbing or cyanosis Psych:  No mood changes, alert and cooperative,seems happy AA is 3 Fall risk is low PHQ 9 score is 3 GAD 7 score is 0  Upstream - 09/17/20 0904      Pregnancy Intention Screening   Does the patient want to become pregnant in the next  year? No    Does the patient's partner want to become pregnant in the next year? No    Would the patient like to discuss contraceptive options today? No      Contraception Wrap Up   Current Method Female Sterilization    End Method Female Sterilization    Contraception Counseling Provided No         Examination chaperoned by Levy Pupa LPN  Impression and Plan: 1. Encounter for gynecological examination with Papanicolaou smear of cervix Pap sent Physical in 1 year Pap in 3 if normal Mammogram yearly Colonoscopy per GI Check CBC,CMP,TSH  Check HIV and Hepatitis C antibody   2. Encounter for screening fecal occult blood testing  3. Elevated cholesterol Check lipids and CMP Will refill Lipitor when labs back

## 2020-09-18 LAB — CBC
Hematocrit: 44.6 % (ref 34.0–46.6)
Hemoglobin: 15.3 g/dL (ref 11.1–15.9)
MCH: 31.3 pg (ref 26.6–33.0)
MCHC: 34.3 g/dL (ref 31.5–35.7)
MCV: 91 fL (ref 79–97)
Platelets: 230 10*3/uL (ref 150–450)
RBC: 4.89 x10E6/uL (ref 3.77–5.28)
RDW: 12 % (ref 11.7–15.4)
WBC: 10.4 10*3/uL (ref 3.4–10.8)

## 2020-09-18 LAB — LIPID PANEL
Chol/HDL Ratio: 5.3 ratio — ABNORMAL HIGH (ref 0.0–4.4)
Cholesterol, Total: 163 mg/dL (ref 100–199)
HDL: 31 mg/dL — ABNORMAL LOW (ref >39)
LDL Chol Calc (NIH): 91 mg/dL (ref 0–99)
Triglycerides: 243 mg/dL — ABNORMAL HIGH (ref 0–149)
VLDL Cholesterol Cal: 41 mg/dL — ABNORMAL HIGH (ref 5–40)

## 2020-09-18 LAB — COMPREHENSIVE METABOLIC PANEL
ALT: 23 IU/L (ref 0–32)
AST: 18 IU/L (ref 0–40)
Albumin/Globulin Ratio: 1.6 (ref 1.2–2.2)
Albumin: 4.5 g/dL (ref 3.8–4.9)
Alkaline Phosphatase: 118 IU/L (ref 44–121)
BUN/Creatinine Ratio: 14 (ref 9–23)
BUN: 12 mg/dL (ref 6–24)
Bilirubin Total: 0.4 mg/dL (ref 0.0–1.2)
CO2: 19 mmol/L — ABNORMAL LOW (ref 20–29)
Calcium: 9.9 mg/dL (ref 8.7–10.2)
Chloride: 101 mmol/L (ref 96–106)
Creatinine, Ser: 0.86 mg/dL (ref 0.57–1.00)
Globulin, Total: 2.8 g/dL (ref 1.5–4.5)
Glucose: 92 mg/dL (ref 65–99)
Potassium: 3.8 mmol/L (ref 3.5–5.2)
Sodium: 141 mmol/L (ref 134–144)
Total Protein: 7.3 g/dL (ref 6.0–8.5)
eGFR: 80 mL/min/{1.73_m2} (ref >59)

## 2020-09-18 LAB — TSH: TSH: 1.85 u[IU]/mL (ref 0.450–4.500)

## 2020-09-18 LAB — HEPATITIS C ANTIBODY: Hep C Virus Ab: <0.1 s/co ratio (ref 0.0–0.9)

## 2020-09-18 LAB — HIV ANTIBODY (ROUTINE TESTING W REFLEX): HIV Screen 4th Generation wRfx: NONREACTIVE

## 2020-09-21 ENCOUNTER — Telehealth: Payer: Self-pay | Admitting: Adult Health

## 2020-09-21 LAB — CYTOLOGY - PAP
Comment: NEGATIVE
Diagnosis: NEGATIVE
High risk HPV: NEGATIVE

## 2020-09-21 MED ORDER — ATORVASTATIN CALCIUM 40 MG PO TABS: 40.0000 mg | ORAL_TABLET | Freq: Every day | ORAL | 0 refills | Status: DC

## 2020-09-21 NOTE — Telephone Encounter (Signed)
Pt aware of labs and pap, will increase Lipitor to 40 mg and recheck lipids in 3 months and increase activity

## 2020-12-25 ENCOUNTER — Other Ambulatory Visit: Payer: Self-pay | Admitting: Adult Health

## 2020-12-27 ENCOUNTER — Other Ambulatory Visit: Payer: Self-pay

## 2020-12-27 ENCOUNTER — Other Ambulatory Visit: Payer: BC Managed Care – PPO

## 2020-12-27 DIAGNOSIS — E7800 Pure hypercholesterolemia, unspecified: Secondary | ICD-10-CM

## 2020-12-28 LAB — CBC
Hematocrit: 46.7 % — ABNORMAL HIGH (ref 34.0–46.6)
Hemoglobin: 15.5 g/dL (ref 11.1–15.9)
MCH: 30.3 pg (ref 26.6–33.0)
MCHC: 33.2 g/dL (ref 31.5–35.7)
MCV: 91 fL (ref 79–97)
Platelets: 244 10*3/uL (ref 150–450)
RBC: 5.11 x10E6/uL (ref 3.77–5.28)
RDW: 12.7 % (ref 11.7–15.4)
WBC: 9.3 10*3/uL (ref 3.4–10.8)

## 2020-12-28 LAB — COMPREHENSIVE METABOLIC PANEL WITH GFR
ALT: 19 IU/L (ref 0–32)
AST: 18 IU/L (ref 0–40)
Albumin/Globulin Ratio: 1.7 (ref 1.2–2.2)
Albumin: 4.4 g/dL (ref 3.8–4.9)
Alkaline Phosphatase: 124 IU/L — ABNORMAL HIGH (ref 44–121)
BUN/Creatinine Ratio: 15 (ref 9–23)
BUN: 14 mg/dL (ref 6–24)
Bilirubin Total: 0.4 mg/dL (ref 0.0–1.2)
CO2: 21 mmol/L (ref 20–29)
Calcium: 9.6 mg/dL (ref 8.7–10.2)
Chloride: 105 mmol/L (ref 96–106)
Creatinine, Ser: 0.92 mg/dL (ref 0.57–1.00)
Globulin, Total: 2.6 g/dL (ref 1.5–4.5)
Glucose: 91 mg/dL (ref 65–99)
Potassium: 4.1 mmol/L (ref 3.5–5.2)
Sodium: 142 mmol/L (ref 134–144)
Total Protein: 7 g/dL (ref 6.0–8.5)
eGFR: 74 mL/min/1.73 (ref >59)

## 2020-12-28 LAB — LIPID PANEL
Chol/HDL Ratio: 4.5 ratio — ABNORMAL HIGH (ref 0.0–4.4)
Cholesterol, Total: 140 mg/dL (ref 100–199)
HDL: 31 mg/dL — ABNORMAL LOW (ref >39)
LDL Chol Calc (NIH): 83 mg/dL (ref 0–99)
Triglycerides: 150 mg/dL — ABNORMAL HIGH (ref 0–149)
VLDL Cholesterol Cal: 26 mg/dL (ref 5–40)

## 2021-02-10 DIAGNOSIS — J0140 Acute pansinusitis, unspecified: Secondary | ICD-10-CM | POA: Diagnosis not present

## 2021-02-10 DIAGNOSIS — Z1159 Encounter for screening for other viral diseases: Secondary | ICD-10-CM | POA: Diagnosis not present

## 2021-02-10 DIAGNOSIS — R519 Headache, unspecified: Secondary | ICD-10-CM | POA: Diagnosis not present

## 2021-02-10 DIAGNOSIS — J3489 Other specified disorders of nose and nasal sinuses: Secondary | ICD-10-CM | POA: Diagnosis not present

## 2021-03-29 ENCOUNTER — Other Ambulatory Visit: Payer: Self-pay | Admitting: Adult Health

## 2021-06-25 ENCOUNTER — Other Ambulatory Visit: Payer: Self-pay | Admitting: Adult Health

## 2021-09-29 ENCOUNTER — Other Ambulatory Visit: Payer: Self-pay | Admitting: Adult Health

## 2022-10-05 ENCOUNTER — Other Ambulatory Visit: Payer: Self-pay | Admitting: Adult Health

## 2023-03-15 DIAGNOSIS — N390 Urinary tract infection, site not specified: Secondary | ICD-10-CM | POA: Diagnosis not present

## 2023-03-15 DIAGNOSIS — R350 Frequency of micturition: Secondary | ICD-10-CM | POA: Diagnosis not present

## 2023-04-05 NOTE — Progress Notes (Signed)
Name: Danielle Robbins DOB: 1966-04-01 MRN: [id number]  History of Present Illness: [name] is a 57 y.o. female who presents today as a new patient at [facility]. All available relevant medical records have been reviewed.   She reports that about a month ago she had an episode of what seemed to be a UTI - had high fever (102 F), chills, low midline abdominal pressure, increased urinary urgency & frequency, general malaise, fatigue, and saw blood on her pad - unclear if that came from the vagina versus the urethra. Denies gross hematuria, dysuria, or acute flank pain; had some mild bilateral back pain associated. Also noted a little bit of blood on toilet paper when wiping after urination. No prior hysterectomy. She took a home urine test and states that was positive. She went to an urgent care and was treated with an antibiotic for suspected UTI; she was told that a urine culture was sent but she never received any word on the result of that.   She denies acute UTI symptoms today - denies fevers, dysuria, gross hematuria, hesitancy, straining to void, or sensations of incomplete emptying.   At baseline she reports urinary urgency, frequency, nocturia, and urge incontinence. She reports stress incontinence with cough/laugh/sneeze. She reports the SUI is predominant.  Voiding >15x/day and 1x/night on average.  She leaks multiple times per days. Wears 1-2 pads per day on average. She reports urinary incontinence is significantly bothersome.  She denies prior attempted treatment for these symptoms.  She reports caffeine intake (coffee & soda; 3-4 caffeinated beverages per day on average).  Denies history of kidney stones.  Denies history of recurrent UTIs or pyelonephritis.    Fall Screening: Do you usually have a device to assist in your mobility? No   Medications: Current Outpatient Medications  Medication Sig Dispense Refill   solifenacin (VESICARE) 10 MG tablet  Take 1 tablet (10 mg total) by mouth daily. 30 tablet 2   atorvastatin (LIPITOR) 40 MG tablet TAKE 1 TABLET BY MOUTH EVERY DAY 90 tablet 3   BIOTIN PO Take by mouth.     cetirizine (ZYRTEC) 10 MG tablet Take 10 mg by mouth daily.     Multiple Vitamin (MULTI-VITAMIN PO) Take by mouth.     No current facility-administered medications for this visit.    Allergies: No Known Allergies  Past Medical History:  Diagnosis Date   Abdominal bloating 03/12/2015   Abnormal Pap smear    Bone spur    neck   Complication of anesthesia    Dry skin 10/20/2014   Elevated cholesterol    PONV (postoperative nausea and vomiting)    Pre-diabetes    RLQ abdominal pain 03/12/2015   Seasonal allergies    Vaginal Pap smear, abnormal    Past Surgical History:  Procedure Laterality Date   COLONOSCOPY N/A 08/26/2015   Procedure: COLONOSCOPY;  Surgeon: Malissa Hippo, MD;  Location: [facility];  Service: Endoscopy;  Laterality: N/A;  1:00   ENDOMETRIAL ABLATION     Dr. 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   LEEP     TUBAL LIGATION     Family History  Problem Relation Age of Onset   Cancer Maternal Aunt        breast  Cancer Paternal Grandmother        breast   Cancer Father        pancreatic   Cancer Brother 69       bone   Coronary artery disease Maternal Grandfather    Social History   Socioeconomic History   Marital status: Married    Spouse name: Not on file   Number of children: Not on file   Years of education: Not on file   Highest education level: Not on file  Occupational History   Not on file  Tobacco Use   Smoking status: Every Day    Current packs/day: 1.00    Average packs/day: 1 pack/day for 30.0 years (30.0 ttl pk-yrs)    Types: Cigarettes   Smokeless tobacco: Never  Vaping Use   Vaping status:  Never Used  Substance and Sexual Activity   Alcohol use: Yes    Alcohol/week: 0.0 standard drinks of alcohol    Comment: occ.   Drug use: No   Sexual activity: Yes    Birth control/protection: Surgical    Comment: tubal and ablation  Other Topics Concern   Not on file  Social History Narrative   Not on file   Social Determinants of Health   Financial Resource Strain: Unknown (09/17/2020)   Overall Financial Resource Strain (CARDIA)    Difficulty of Paying Living Expenses: Patient declined  Food Insecurity: No Food Insecurity (09/17/2020)   Hunger Vital Sign    Worried About Running Out of Food in the Last Year: Never true    Ran Out of Food in the Last Year: Never true  Transportation Needs: No Transportation Needs (09/17/2020)   PRAPARE - Administrator, Civil Service (Medical): No    Lack of Transportation (Non-Medical): No  Physical Activity: Insufficiently Active (09/17/2020)   Exercise Vital Sign    Days of Exercise per Week: 1 day    Minutes of Exercise per Session: 10 min  Stress: No Stress Concern Present (09/17/2020)   [employer] of Occupational Health - Occupational Stress Questionnaire    Feeling of Stress : Not at all  Social Connections: Moderately Integrated (09/17/2020)   Social Connection and Isolation Panel [NHANES]    Frequency of Communication with Friends and Family: More than three times a week    Frequency of Social Gatherings with Friends and Family: Twice a week    Attends Religious Services: 1 to 4 times per year    Active Member of [employer] or Organizations: No    Attends Banker Meetings: Never    Marital Status: Married  Catering manager Violence: Not At Risk (09/17/2020)   Humiliation, Afraid, Rape, and Kick questionnaire    Fear of Current or Ex-Partner: No    Emotionally Abused: No    Physically Abused: No    Sexually Abused: No    SUBJECTIVE  Review of Systems Constitutional: Patient denies any unintentional  weight loss or change in strength lntegumentary: Patient denies any rashes or pruritus Eyes: Patient denies dry eyes ENT: Patient reports dry mouth Cardiovascular: Patient denies chest pain or syncope Respiratory: Patient denies shortness of breath Gastrointestinal: Patient denies nausea, vomiting, constipation, or diarrhea Musculoskeletal: Patient denies muscle cramps or weakness Neurologic: Patient denies convulsions or seizures Allergic/Immunologic: Patient denies recent allergic reaction(s) Hematologic/Lymphatic: Patient denies bleeding tendencies Endocrine: Patient denies heat/cold intolerance  GU: As per HPI.  OBJECTIVE Vitals:   04/11/23 0836  BP: 116/78  Pulse: 75  Temp: 98.7 F (37.1  C)   There is no height or weight on file to calculate BMI.  Physical Examination Constitutional: No obvious distress; patient is non-toxic appearing  Cardiovascular: No visible lower extremity edema.  Respiratory: The patient does not have audible wheezing/stridor; respirations do not appear labored  Gastrointestinal: Abdomen non-distended Musculoskeletal: Normal ROM of UEs  Skin: No obvious rashes/open sores  Neurologic: CN 2-12 grossly intact Psychiatric: Answered questions appropriately with normal affect  Hematologic/Lymphatic/Immunologic: No obvious bruises or sites of spontaneous bleeding  UA: 6-10 WBC/hpf, otherwise unremarkable PVR: 54 ml  ASSESSMENT Urinary incontinence, unspecified type - Plan: BLADDER SCAN AMB NON-IMAGING, Urinalysis, Routine w reflex microscopic  History of UTI - Plan: BLADDER SCAN AMB NON-IMAGING, Urinalysis, Routine w reflex microscopic, US RENAL  OAB (overactive bladder) - Plan: solifenacin (VESICARE) 10 MG tablet  Urge incontinence - Plan: solifenacin (VESICARE) 10 MG tablet  Stress incontinence of urine  Hematuria, unspecified type - Plan: US RENAL  Smoker - Plan: US RENAL  We discussed unclear etiology for recent acute symptoms. Difficult  to discern since she is unsure if blood was coming from urethra / bladder or vagina.  - Requesting prior records from urgent care.  - We agreed to proceed cautiously with RUS and cystoscopy for further evaluation due to her increased risk for GU malignancy based on smoking history, which was discussed.  - She was also advised to consult with her GYN provider for evaluation of possible post-menopausal vaginal bleeding.  For OAB with urinary frequency, urgency, and urge incontinence:  We discussed the symptoms of overactive bladder (OAB), which include urinary urgency, frequency, nocturia, with or without urge incontinence. While we may not know the exact etiology of OAB, several risk factors can be identified. Likely exacerbated by caffeine intake.   We discussed the following management options in detail including potential benefits, risks, and side effects: Behavioral therapy: Decreasing bladder irritants (such as caffeine) Double voiding Medication(s):  - For anticholinergic medications, we discussed the potential side effects of anticholinergics including dry eyes, dry mouth, constipation, cognitive impairment and urinary retention.   She decided to proceed with Vesicare 10 mg daily and to work on behavioral modifications including minimizing caffeine intake.  Stress urinary incontinence. The etiology of this condition was explained in detail to include pelvic floor muscle relaxation and detachment of the urethra away from its connection to the pubic bone. Her risk profile was reviewed, including childbirth.  The management options were reviewed to include: No intervention, observation. Non-surgical options: Pelvic floor muscle rehabilitation Incontinence pessary Surgical consultation: Options may include a midurethral sling (with synthetic mesh implant or autologous fascial sling), a Burch urethropexy, or transurethral injection of a bulking agent.   She elected to consider SUI  treatment options and will notify provider if she elects to proceed with any of those.  Pt verbalized understanding and agreement. All questions were answered.  PLAN Advised the following: 1. RUS.  2. Start Vesicare 10 mg daily. 3. Minimize caffeine intake. 4. Return for 1st available cystoscopy with any urology MD with RUS prior. 5. Follow up with GYN provider.  Orders Placed This Encounter  Procedures   US RENAL    Standing Status:   Future    Standing Expiration Date:   04/10/2024    Order Specific Question:   Reason for Exam (SYMPTOM  OR DIAGNOSIS REQUIRED)    Answer:   hematuria, smoking history    Order Specific Question:   Preferred imaging location?    Answer:   [facility]   Urinalysis, Routine w reflex microscopic   BLADDER SCAN AMB NON-IMAGING    It has been explained that the patient is to follow regularly with their PCP in addition to all other providers involved in their care and to follow instructions provided by these respective offices. Patient advised to contact urology clinic if any urologic-pertaining questions, concerns, new symptoms or problems arise in the interim period.  Patient Instructions  Plan: 1. For OAB with urinary frequency, urgency, and urge incontinence:  Start Vesicare 10 mg daily and work on reducing caffeine intake.  2. For stress urinary incontinence (leakage with cough / laugh / sneeze/ etc.):  Consider option including pelvic floor physical therapy, incontinence pessary, or surgical consultation. Please notify our office if you would like a referral for any of those options.  3. For recent UTI / hematuria:  Will further evaluate with renal/bladder ultrasound and follow up for cystoscopy. Also please contact your gynecology provider to assess for possible post-menopausal vaginal bleeding. Requesting records from urgent care visit.                     Electronically signed by:  Donnita Falls, MSN, FNP-C,  CUNP 04/11/2023 11:14 AM

## 2023-04-11 ENCOUNTER — Ambulatory Visit: Payer: 59 | Admitting: Urology

## 2023-04-11 ENCOUNTER — Encounter: Payer: Self-pay | Admitting: Urology

## 2023-04-11 VITALS — BP 116/78 | HR 75 | Temp 98.7°F

## 2023-04-11 DIAGNOSIS — R32 Unspecified urinary incontinence: Secondary | ICD-10-CM | POA: Diagnosis not present

## 2023-04-11 DIAGNOSIS — F17200 Nicotine dependence, unspecified, uncomplicated: Secondary | ICD-10-CM | POA: Insufficient documentation

## 2023-04-11 DIAGNOSIS — N3281 Overactive bladder: Secondary | ICD-10-CM | POA: Diagnosis not present

## 2023-04-11 DIAGNOSIS — N3941 Urge incontinence: Secondary | ICD-10-CM | POA: Insufficient documentation

## 2023-04-11 DIAGNOSIS — N3946 Mixed incontinence: Secondary | ICD-10-CM | POA: Diagnosis not present

## 2023-04-11 DIAGNOSIS — N393 Stress incontinence (female) (male): Secondary | ICD-10-CM | POA: Insufficient documentation

## 2023-04-11 DIAGNOSIS — R319 Hematuria, unspecified: Secondary | ICD-10-CM

## 2023-04-11 DIAGNOSIS — Z87440 Personal history of urinary (tract) infections: Secondary | ICD-10-CM

## 2023-04-11 LAB — URINALYSIS, ROUTINE W REFLEX MICROSCOPIC
Bilirubin, UA: NEGATIVE
Glucose, UA: NEGATIVE
Ketones, UA: NEGATIVE
Nitrite, UA: NEGATIVE
Protein,UA: NEGATIVE
RBC, UA: NEGATIVE
Specific Gravity, UA: 1.01 (ref 1.005–1.030)
Urobilinogen, Ur: 0.2 mg/dL (ref 0.2–1.0)
pH, UA: 6 (ref 5.0–7.5)

## 2023-04-11 LAB — MICROSCOPIC EXAMINATION
Bacteria, UA: NONE SEEN
Epithelial Cells (non renal): >10 /[HPF] — AB (ref 0–10)

## 2023-04-11 LAB — BLADDER SCAN AMB NON-IMAGING: Scan Result: 54

## 2023-04-11 MED ORDER — SOLIFENACIN SUCCINATE 10 MG PO TABS: 10.0000 mg | ORAL_TABLET | Freq: Every day | ORAL | 2 refills | Status: DC

## 2023-04-11 NOTE — Patient Instructions (Addendum)
Plan: 1. For OAB with urinary frequency, urgency, and urge incontinence:  Start Vesicare 10 mg daily and work on reducing caffeine intake.  2. For stress urinary incontinence (leakage with cough / laugh / sneeze/ etc.):  Consider option including pelvic floor physical therapy, incontinence pessary, or surgical consultation. Please notify our office if you would like a referral for any of those options.  3. For recent UTI / hematuria:  Will further evaluate with renal/bladder ultrasound and follow up for cystoscopy. Also please contact your gynecology provider to assess for possible post-menopausal vaginal bleeding. Requesting records from urgent care visit.

## 2023-04-16 ENCOUNTER — Ambulatory Visit (HOSPITAL_COMMUNITY)
Admission: RE | Admit: 2023-04-16 | Discharge: 2023-04-16 | Disposition: A | Discharge status: Home / Self Care | Payer: 59 | Source: Ambulatory Visit | Attending: Urology | Admitting: Urology

## 2023-04-16 DIAGNOSIS — F17200 Nicotine dependence, unspecified, uncomplicated: Secondary | ICD-10-CM | POA: Insufficient documentation

## 2023-04-16 DIAGNOSIS — Z87440 Personal history of urinary (tract) infections: Secondary | ICD-10-CM | POA: Diagnosis not present

## 2023-04-16 DIAGNOSIS — R319 Hematuria, unspecified: Secondary | ICD-10-CM | POA: Diagnosis not present

## 2023-04-23 ENCOUNTER — Ambulatory Visit: Payer: 59 | Admitting: Urology

## 2023-04-23 ENCOUNTER — Encounter: Payer: Self-pay | Admitting: Urology

## 2023-04-23 VITALS — BP 117/77 | HR 103

## 2023-04-23 DIAGNOSIS — R319 Hematuria, unspecified: Secondary | ICD-10-CM | POA: Diagnosis not present

## 2023-04-23 DIAGNOSIS — R1031 Right lower quadrant pain: Secondary | ICD-10-CM | POA: Diagnosis not present

## 2023-04-23 DIAGNOSIS — R32 Unspecified urinary incontinence: Secondary | ICD-10-CM | POA: Diagnosis not present

## 2023-04-23 NOTE — Progress Notes (Signed)
History of Present Illness: 57 yo female returns for f/u of possible hematuria.  She was seen about a week ago for blood on her toilet tissue after voiding as well as LUTS/incontinence.  She has not started Solifenacin yet.  She has not noted hematuria or blood spots hematuria paper since that visit.   Past Medical History:  Diagnosis Date   Abdominal bloating 03/12/2015   Abnormal Pap smear    Bone spur    neck   Complication of anesthesia    Dry skin 10/20/2014   Elevated cholesterol    PONV (postoperative nausea and vomiting)    Pre-diabetes    RLQ abdominal pain 03/12/2015   Seasonal allergies    Vaginal Pap smear, abnormal     Past Surgical History:  Procedure Laterality Date   COLONOSCOPY N/A 08/26/2015   Procedure: COLONOSCOPY;  Surgeon: Malissa Hippo, MD;  Location: [facility];  Service: Endoscopy;  Laterality: N/A;  1:00   ENDOMETRIAL ABLATION     Dr. 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 If you have any questions, ask your nurse or doctor.          atorvastatin 40 MG tablet Commonly known as: LIPITOR TAKE 1 TABLET BY MOUTH EVERY DAY   BIOTIN PO Take by mouth.   cetirizine 10 MG tablet Commonly known as: ZYRTEC Take 10 mg by mouth daily.   MULTI-VITAMIN PO Take by mouth.   solifenacin 10 MG tablet Commonly known as: VESICARE Take 1 tablet (10 mg total) by mouth daily.        Allergies: No Known Allergies  Family History  Problem Relation Age of Onset   Cancer Maternal Aunt        breast   Cancer Paternal Grandmother        breast   Cancer Father        pancreatic   Cancer Brother 45       bone    Coronary artery disease Maternal Grandfather     Social History:  reports that she has been smoking cigarettes. She has a 30 pack-year smoking history. She has never used smokeless tobacco. She reports current alcohol use. She reports that she does not use drugs.  ROS: A complete review of systems was performed.  All systems are negative except for pertinent findings as noted.  Physical Exam:  Vital signs in last 24 hours: There were no vitals taken for this visit. Constitutional:  Alert and oriented, No acute distress Cardiovascular: Regular rate  Respiratory: Normal respiratory effort Neurologic: Grossly intact, no focal deficits Psychiatric: Normal mood and affect  I have reviewed prior pt notes  I have reviewed urinalysis results--no microhematuria last visit or this one  I have independently reviewed prior imaging--U/S images reviewed (official reading not done yet)  I have reviewed prior bladder scan volume--54 mL   Impression/Assessment:  LUTS w/ incontinence. Pt has not started meds or decided on treatment yet.  Blood on toilet tissue when wiping--no blood in urine however  Plan:  Stop smoking  2. I do not feel that she needs cysto as her voided urine has always been clear  3. Would recommend PT for SUI if soifenacin does not help  4. RTO prn

## 2023-04-24 LAB — URINALYSIS, ROUTINE W REFLEX MICROSCOPIC
Bilirubin, UA: NEGATIVE
Glucose, UA: NEGATIVE
Ketones, UA: NEGATIVE
Nitrite, UA: NEGATIVE
Protein,UA: NEGATIVE
RBC, UA: NEGATIVE
Specific Gravity, UA: 1.01 (ref 1.005–1.030)
Urobilinogen, Ur: 0.2 mg/dL (ref 0.2–1.0)
pH, UA: 7 (ref 5.0–7.5)

## 2023-04-24 LAB — MICROSCOPIC EXAMINATION
Bacteria, UA: NONE SEEN
RBC, Urine: NONE SEEN /[HPF] (ref 0–2)

## 2023-05-14 ENCOUNTER — Other Ambulatory Visit: Payer: 59

## 2023-05-15 ENCOUNTER — Ambulatory Visit
Admission: RE | Admit: 2023-05-15 | Discharge: 2023-05-15 | Disposition: A | Discharge status: Home / Self Care | Payer: 59 | Source: Ambulatory Visit | Attending: Urology | Admitting: Urology

## 2023-05-15 DIAGNOSIS — R1031 Right lower quadrant pain: Secondary | ICD-10-CM | POA: Diagnosis not present

## 2023-05-15 DIAGNOSIS — K5900 Constipation, unspecified: Secondary | ICD-10-CM | POA: Diagnosis not present

## 2023-05-15 MED ORDER — IOPAMIDOL (ISOVUE-300) INJECTION 61%
100.0000 mL | Freq: Once | INTRAVENOUS | Status: AC | PRN
  Administered 2023-05-15: 100 mL via INTRAVENOUS

## 2023-05-29 ENCOUNTER — Ambulatory Visit (INDEPENDENT_AMBULATORY_CARE_PROVIDER_SITE_OTHER): Payer: 59 | Admitting: Adult Health

## 2023-05-29 ENCOUNTER — Other Ambulatory Visit (HOSPITAL_COMMUNITY)
Admission: RE | Admit: 2023-05-29 | Discharge: 2023-05-29 | Disposition: A | Discharge status: Home / Self Care | Payer: 59 | Source: Ambulatory Visit | Attending: Adult Health | Admitting: Adult Health

## 2023-05-29 ENCOUNTER — Encounter: Payer: Self-pay | Admitting: Adult Health

## 2023-05-29 VITALS — BP 123/78 | HR 85 | Ht 65.0 in | Wt 184.5 lb

## 2023-05-29 DIAGNOSIS — N951 Menopausal and female climacteric states: Secondary | ICD-10-CM | POA: Diagnosis not present

## 2023-05-29 DIAGNOSIS — Z1331 Encounter for screening for depression: Secondary | ICD-10-CM | POA: Diagnosis not present

## 2023-05-29 DIAGNOSIS — N950 Postmenopausal bleeding: Secondary | ICD-10-CM | POA: Diagnosis not present

## 2023-05-29 DIAGNOSIS — N393 Stress incontinence (female) (male): Secondary | ICD-10-CM

## 2023-05-29 DIAGNOSIS — Z1211 Encounter for screening for malignant neoplasm of colon: Secondary | ICD-10-CM | POA: Insufficient documentation

## 2023-05-29 DIAGNOSIS — Z1231 Encounter for screening mammogram for malignant neoplasm of breast: Secondary | ICD-10-CM

## 2023-05-29 DIAGNOSIS — Z01419 Encounter for gynecological examination (general) (routine) without abnormal findings: Secondary | ICD-10-CM | POA: Diagnosis not present

## 2023-05-29 DIAGNOSIS — N3941 Urge incontinence: Secondary | ICD-10-CM | POA: Diagnosis not present

## 2023-05-29 DIAGNOSIS — N941 Unspecified dyspareunia: Secondary | ICD-10-CM | POA: Diagnosis not present

## 2023-05-29 DIAGNOSIS — E7800 Pure hypercholesterolemia, unspecified: Secondary | ICD-10-CM | POA: Diagnosis not present

## 2023-05-29 LAB — HEMOCCULT GUIAC POC 1CARD (OFFICE): Fecal Occult Blood, POC: NEGATIVE

## 2023-05-29 MED ORDER — PREMARIN 0.625 MG/GM VA CREA
TOPICAL_CREAM | VAGINAL | 3 refills | Status: AC
Start: 2023-05-29 — End: ?

## 2023-05-29 NOTE — Progress Notes (Signed)
Patient ID: [name], female   DOB: February 03, 1966, 57 y.o.   MRN: [id number] History of Present Illness: Danielle Robbins is a 57 year old white female, married, PM in for well woman gyn exam and pap. 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 She has pain with sex due to dryness. Urology not sure where blood was coming from. Has mixed UI, was on vesicare and stopped made her chest cramp.   PCP is Danielle Robbins   Current Medications, Allergies, Past Medical History, Past Surgical History, Family History and Social History were reviewed in [employer] record.     Review of Systems: Patient denies any headaches, hearing loss, fatigue, blurred vision, shortness of breath, chest pain, abdominal pain, problems with bowel movements.   No joint pain or mood swings.  See HPT for positives    Physical Exam:BP 123/78 (BP Location: Left Arm, Patient Position: Sitting, Cuff Size: Normal)   Pulse 85   Ht 5\' 5"  (1.651 m)   Wt 184 lb 8 oz (83.7 kg)   BMI 30.70 kg/m   General:  Well developed, well nourished, no acute distress Skin:  Warm and dry Neck:  Midline trachea, normal thyroid, good ROM, no lymphadenopathy Lungs; Clear to auscultation bilaterally Breast:  No dominant palpable mass, retraction, or nipple discharge Cardiovascular: Regular rate and rhythm Abdomen:  Soft, non tender, no hepatosplenomegaly Pelvic:  External genitalia is normal in appearance, no lesions.  The vagina is pale with loss of moisture. Urethra has no lesions or masses. The cervix is smooth, pap with HR HPV genotyping performed.  Uterus is felt to be normal size, shape, and contour.  No adnexal masses or tenderness noted.Bladder is non tender, no masses felt. Rectal: Good sphincter tone, no polyps, or hemorrhoids felt.  Hemoccult negative. Extremities/musculoskeletal:  No swelling or  varicosities noted, no clubbing or cyanosis Psych:  No mood changes, alert and cooperative,seems happy AA is 1 Fall risk is low    05/29/2023   10:45 AM 09/17/2020    9:06 AM 12/04/2017    9:45 AM  Depression screen PHQ 2/9  Decreased Interest 0 0 0  Down, Depressed, Hopeless 1 0 0  PHQ - 2 Score 1 0 0  Altered sleeping 2 1   Tired, decreased energy 1 2   Change in appetite 0 0   Feeling bad or failure about yourself  0 0   Trouble concentrating 0 0   Moving slowly or fidgety/restless 0 0   Suicidal thoughts 0 0   PHQ-9 Score 4 3        05/29/2023   10:46 AM 09/17/2020    9:06 AM  GAD 7 : Generalized Anxiety Score  Nervous, Anxious, on Edge 0 0  Control/stop worrying 1 0  Worry too much - different things 1 0  Trouble relaxing 0 0  Restless 0 0  Easily annoyed or irritable 1 0  Afraid - awful might happen 0 0  Total GAD 7 Score 3 0      Upstream - 05/29/23 1037       Pregnancy Intention Screening   Does the patient want to become pregnant in the next year? No    Does the patient's partner want to become pregnant in the next year? No    Would the patient like to discuss contraceptive options today? No      Contraception  Wrap Up   Current Method Female Sterilization    End Method Female Sterilization    Contraception Counseling Provided No            Examination chaperoned by Danielle Dus RN  Impression and plan: 1. Encounter for gynecological examination with Papanicolaou smear of cervix Pap sent Pap in 3 years if normal Physical in 1 year - Cytology - PAP( [facility]) - CBC - Comprehensive metabolic panel - Lipid panel  2. Encounter for screening fecal occult blood testing Hemoccult was negative  - POCT occult blood stool  3. Urge incontinence  4. Stress incontinence of urine  5. Elevated cholesterol  - Lipid panel  6. Vaginal dryness, menopausal +vaginal dryness  Will try PVC Meds ordered this encounter  Medications   conjugated  estrogens (PREMARIN) vaginal cream    Sig: Use 0.5 gm in vagina nightly for 2 weeks then 2-3 weekly    Dispense:  30 g    Refill:  3    Order Specific Question:   Supervising Provider    Answer:   Danielle Robbins [[id number]]   Will reassess in about 3 weeks   7. Dyspareunia, female Has pain, due to dryness will try PVC  8. Screening mammogram for breast cancer Pt to call for mammogram  - MM 3D SCREENING MAMMOGRAM BILATERAL BREAST; Future  9. PMB (postmenopausal bleeding) Had ? Vaginal bleeding Will get pelvic US to assess uterus and ovaries in office in about 3 weeks  and see me after for ROS  - US PELVIC COMPLETE WITH TRANSVAGINAL; Future

## 2023-05-30 LAB — COMPREHENSIVE METABOLIC PANEL
ALT: 16 [IU]/L (ref 0–32)
AST: 20 [IU]/L (ref 0–40)
Albumin: 4.5 g/dL (ref 3.8–4.9)
Alkaline Phosphatase: 133 [IU]/L — ABNORMAL HIGH (ref 44–121)
BUN/Creatinine Ratio: 12 (ref 9–23)
BUN: 10 mg/dL (ref 6–24)
Bilirubin Total: 0.5 mg/dL (ref 0.0–1.2)
CO2: 21 mmol/L (ref 20–29)
Calcium: 9.4 mg/dL (ref 8.7–10.2)
Chloride: 104 mmol/L (ref 96–106)
Creatinine, Ser: 0.83 mg/dL (ref 0.57–1.00)
Globulin, Total: 2.8 g/dL (ref 1.5–4.5)
Glucose: 95 mg/dL (ref 70–99)
Potassium: 4.1 mmol/L (ref 3.5–5.2)
Sodium: 142 mmol/L (ref 134–144)
Total Protein: 7.3 g/dL (ref 6.0–8.5)
eGFR: 82 mL/min/{1.73_m2} (ref >59)

## 2023-05-30 LAB — CBC
Hematocrit: 47.8 % — ABNORMAL HIGH (ref 34.0–46.6)
Hemoglobin: 15.9 g/dL (ref 11.1–15.9)
MCH: 30.9 pg (ref 26.6–33.0)
MCHC: 33.3 g/dL (ref 31.5–35.7)
MCV: 93 fL (ref 79–97)
Platelets: 223 10*3/uL (ref 150–450)
RBC: 5.15 x10E6/uL (ref 3.77–5.28)
RDW: 12.2 % (ref 11.7–15.4)
WBC: 9.6 10*3/uL (ref 3.4–10.8)

## 2023-05-30 LAB — LIPID PANEL
Chol/HDL Ratio: 6.1 {ratio} — ABNORMAL HIGH (ref 0.0–4.4)
Cholesterol, Total: 183 mg/dL (ref 100–199)
HDL: 30 mg/dL — ABNORMAL LOW (ref >39)
LDL Chol Calc (NIH): 113 mg/dL — ABNORMAL HIGH (ref 0–99)
Triglycerides: 227 mg/dL — ABNORMAL HIGH (ref 0–149)
VLDL Cholesterol Cal: 40 mg/dL (ref 5–40)

## 2023-05-30 LAB — CYTOLOGY - PAP
Comment: NEGATIVE
Diagnosis: NEGATIVE
High risk HPV: NEGATIVE

## 2023-06-06 ENCOUNTER — Ambulatory Visit: Payer: 59 | Admitting: Adult Health

## 2023-06-06 ENCOUNTER — Ambulatory Visit (INDEPENDENT_AMBULATORY_CARE_PROVIDER_SITE_OTHER): Payer: 59

## 2023-06-06 ENCOUNTER — Encounter: Payer: Self-pay | Admitting: Adult Health

## 2023-06-06 VITALS — BP 125/75 | HR 86 | Ht 65.0 in | Wt 185.0 lb

## 2023-06-06 DIAGNOSIS — N950 Postmenopausal bleeding: Secondary | ICD-10-CM

## 2023-06-06 DIAGNOSIS — Q5128 Other doubling of uterus, other specified: Secondary | ICD-10-CM | POA: Insufficient documentation

## 2023-06-06 NOTE — Progress Notes (Signed)
PELVIC US TA/TV:homogeneous anteverted septate uterus,EEC right 1.3 mm,EEC left 1.7 mm,mid endometrium 3.7 mm,normal right ovary,simple left paraovarian cyst 1.2 x 1 x .9 cm,ovaries appear mobile,no free fluid,left adnexal discomfort during ultrasound

## 2023-06-06 NOTE — Progress Notes (Signed)
  Subjective:     Patient ID: Danielle Robbins, female   DOB: May 11, 1966, 57 y.o.   MRN: [id number]  HPI Danielle Robbins is a 57 year old white female,married, PM in for [place] to assess uterus had bleeding.     Component Value Date/Time   DIAGPAP  05/29/2023 1053    - Negative for intraepithelial lesion or malignancy (NILM)   DIAGPAP  09/17/2020 0904    - Negative for intraepithelial lesion or malignancy (NILM)   DIAGPAP  12/04/2017 0000    NEGATIVE FOR INTRAEPITHELIAL LESIONS OR MALIGNANCY.   HPVHIGH Negative 05/29/2023 1053   HPVHIGH Negative 09/17/2020 0904   ADEQPAP  05/29/2023 1053    Satisfactory for evaluation. The presence or absence of an   ADEQPAP  05/29/2023 1053    endocervical/transformation zone component cannot be determined because   ADEQPAP of atrophy. 05/29/2023 1053    PCP is [facility]  Review of Systems PMB    Objective:   Physical Exam BP 125/75 (BP Location: Left Arm, Patient Position: Sitting, Cuff Size: Normal)   Pulse 86   Ht 5\' 5"  (1.651 m)   Wt 185 lb (83.9 kg)   BMI 30.79 kg/m     Skin warm and dry.  Lungs: clear to ausculation bilaterally. Cardiovascular: regular rate and rhythm.  US showed septate uterus, EEC RT 1.3 mm,left 1.7 mm and mid endometrium 3.7. ovaries normal has small simple para ovarian cyst on the left. Discussed [place] with Dr Charlotta Newton   Upstream - 06/06/23 1424       Pregnancy Intention Screening   Does the patient want to become pregnant in the next year? N/A    Does the patient's partner want to become pregnant in the next year? N/A    Would the patient like to discuss contraceptive options today? N/A      Contraception Wrap Up   Current Method Female Sterilization    End Method Female Sterilization    Contraception Counseling Provided No             Assessment:     1. PMB (postmenopausal bleeding)  2. Septate uterus      Plan:     Return 06/14/23 to see Dr Charlotta Newton about possible hysteroscopy and endometrial sampling     Danielle Robbins will lose insurance end of December

## 2023-06-14 ENCOUNTER — Ambulatory Visit: Payer: 59 | Admitting: Obstetrics & Gynecology

## 2023-06-14 ENCOUNTER — Encounter: Payer: Self-pay | Admitting: Obstetrics & Gynecology

## 2023-06-14 VITALS — BP 112/73 | HR 85 | Ht 65.0 in | Wt 186.0 lb

## 2023-06-14 DIAGNOSIS — N941 Unspecified dyspareunia: Secondary | ICD-10-CM | POA: Diagnosis not present

## 2023-06-14 DIAGNOSIS — N950 Postmenopausal bleeding: Secondary | ICD-10-CM

## 2023-06-14 NOTE — Progress Notes (Signed)
GYN VISIT Patient name: Danielle Robbins MRN [id number]  Date of birth: 1965-11-14 Chief Complaint:   discuss surgery  History of Present Illness:   Danielle Robbins is a 57 y.o. G1P1 PM female being seen today for the following concerns:  US/PMB follow up:  Pelvic US 06/06/2023: 3.7 x 2.4 x 3.6 cm - 16 cc anteverted septate uterus noted.  Endometrium measuring 3.7 mm and symmetrical.  Normal ovaries bilaterally.     Upon further questioning it does sound as though there was ?PMB.  Recently had a UTI and when she wore a pad- noted possible spotting on her pad- was not sure if urinary or vaginal in nature.  Seen by urology and advised to f/u with gyn.  She denies any vaginal bleeding.  On occasion may note some RLQ pain- every so often.  Rates her pain 3/10- seems like a dull ache, does not require medication and usually resolves on its own.  Recently prescribed Premarin vaginal cream due to dyspareunia.  She has not yet started this medication.  She rates her symptoms a 9/10.  She is not using any lubricants    No LMP recorded. Patient has had an ablation.  Notes dyspareunia- rates her symptoms 9/10    Review of Systems:   Pertinent items are noted in HPI Denies fever/chills, dizziness, headaches, visual disturbances, fatigue, shortness of breath, chest pain, abdominal pain, vomiting, Pertinent History Reviewed:   Past Surgical History:  Procedure Laterality Date   COLONOSCOPY N/A 08/26/2015   Procedure: COLONOSCOPY;  Surgeon: Malissa Hippo, MD;  Location: [facility];  Service: Endoscopy;  Laterality: N/A;  1:00   ENDOMETRIAL ABLATION     Dr. 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 Physical Assessment:   Vitals:   06/14/23 1032  BP: 112/73  Pulse: 85  Weight: 186 lb (84.4 kg)  Height: 5\' 5"  (1.651 m)  Body mass index is 30.95 kg/m.       Physical Examination:   General appearance: alert, well appearing, and in no distress  Psych: mood appropriate, normal affect  Skin: warm & dry   Cardiovascular: normal heart rate noted  Respiratory: normal respiratory effort, no distress  Extremities: no edema   Chaperone: N/A    Assessment & Plan:  1) recent UTI, ?PMB -Upon review of clinical history it sounds as though the spotting may have been urologic -Reviewed recent ultrasound, thin endometrium, no further workup indicated  2) Dyspareunia -Reviewed over-the-counter remedies including personal moisturizers like Luvenia, water-based lubricants -Encourage patient to start regular use of Premarin -Would advise follow-up in 2 to 3 months  No orders of the defined types were placed in this encounter.   Return in about 3 months (around 09/12/2023) for Medication  follow up with [name].   Myna Hidalgo, DO Attending Obstetrician & Gynecologist, [facility] for [employer], [facility]

## 2023-06-18 ENCOUNTER — Other Ambulatory Visit: Payer: 59

## 2023-06-18 ENCOUNTER — Ambulatory Visit: Payer: 59 | Admitting: Adult Health

## 2023-09-12 ENCOUNTER — Ambulatory Visit: Payer: 59 | Admitting: Adult Health

## 2023-10-17 ENCOUNTER — Other Ambulatory Visit: Payer: Self-pay | Admitting: Adult Health

## 2024-01-14 ENCOUNTER — Other Ambulatory Visit: Payer: Self-pay | Admitting: Women's Health

## 2024-01-21 ENCOUNTER — Other Ambulatory Visit: Payer: Self-pay | Admitting: *Deleted

## 2024-01-21 MED ORDER — ATORVASTATIN CALCIUM 40 MG PO TABS: 40.0000 mg | ORAL_TABLET | Freq: Every day | ORAL | 0 refills | Status: DC

## 2024-04-15 ENCOUNTER — Other Ambulatory Visit: Payer: Self-pay | Admitting: Women's Health

## 2024-04-21 ENCOUNTER — Other Ambulatory Visit: Payer: Self-pay | Admitting: Adult Health

## 2024-04-21 MED ORDER — ATORVASTATIN CALCIUM 40 MG PO TABS: 40.0000 mg | ORAL_TABLET | Freq: Every day | ORAL | 0 refills | Status: AC

## 2024-04-21 NOTE — Progress Notes (Signed)
 Refilled Lipitor.

## 2024-05-23 ENCOUNTER — Encounter: Payer: Self-pay | Admitting: Adult Health

## 2024-07-16 ENCOUNTER — Other Ambulatory Visit: Payer: Self-pay | Admitting: Adult Health
